# Patient Record
Sex: Female | Born: 1970 | Race: White | Hispanic: Yes | Marital: Married | Smoking: Never smoker
Health system: Southern US, Community
[De-identification: ages and names within clinical notes are randomized; demographics above are authoritative.]

## PROBLEM LIST (undated history)

## (undated) DIAGNOSIS — G43909 Migraine, unspecified, not intractable, without status migrainosus: Secondary | ICD-10-CM

## (undated) DIAGNOSIS — F329 Major depressive disorder, single episode, unspecified: Secondary | ICD-10-CM

## (undated) DIAGNOSIS — F32A Depression, unspecified: Secondary | ICD-10-CM

## (undated) DIAGNOSIS — I10 Essential (primary) hypertension: Secondary | ICD-10-CM

## (undated) HISTORY — DX: Essential (primary) hypertension: I10

## (undated) HISTORY — DX: Major depressive disorder, single episode, unspecified: F32.9

## (undated) HISTORY — DX: Depression, unspecified: F32.A

## (undated) HISTORY — PX: CHOLECYSTECTOMY: SHX55

## (undated) HISTORY — PX: TUBAL LIGATION: SHX77

---

## 2006-01-25 ENCOUNTER — Other Ambulatory Visit: Admission: RE | Admit: 2006-01-25 | Discharge: 2006-01-25 | Payer: Self-pay | Admitting: Obstetrics and Gynecology

## 2006-06-04 ENCOUNTER — Encounter (INDEPENDENT_AMBULATORY_CARE_PROVIDER_SITE_OTHER): Payer: Self-pay | Admitting: *Deleted

## 2006-06-04 ENCOUNTER — Inpatient Hospital Stay (HOSPITAL_COMMUNITY): Admission: AD | Admit: 2006-06-04 | Discharge: 2006-06-07 | Payer: Self-pay | Admitting: Obstetrics and Gynecology

## 2006-07-16 ENCOUNTER — Other Ambulatory Visit: Admission: RE | Admit: 2006-07-16 | Discharge: 2006-07-16 | Payer: Self-pay | Admitting: Obstetrics and Gynecology

## 2009-03-06 ENCOUNTER — Ambulatory Visit (HOSPITAL_COMMUNITY): Admission: AD | Admit: 2009-03-06 | Discharge: 2009-03-07 | Payer: Self-pay | Admitting: Obstetrics & Gynecology

## 2009-03-06 ENCOUNTER — Encounter (INDEPENDENT_AMBULATORY_CARE_PROVIDER_SITE_OTHER): Payer: Self-pay | Admitting: General Surgery

## 2009-10-11 ENCOUNTER — Encounter (INDEPENDENT_AMBULATORY_CARE_PROVIDER_SITE_OTHER): Payer: Self-pay | Admitting: Obstetrics & Gynecology

## 2009-10-11 ENCOUNTER — Inpatient Hospital Stay (HOSPITAL_COMMUNITY): Admission: RE | Admit: 2009-10-11 | Discharge: 2009-10-14 | Payer: Self-pay | Admitting: Obstetrics & Gynecology

## 2010-09-21 LAB — BASIC METABOLIC PANEL
BUN: 4 mg/dL — ABNORMAL LOW (ref 6–23)
CO2: 19 mEq/L (ref 19–32)
Chloride: 110 mEq/L (ref 96–112)
Creatinine, Ser: 0.5 mg/dL (ref 0.4–1.2)

## 2010-09-21 LAB — CBC
MCHC: 33.7 g/dL (ref 30.0–36.0)
MCHC: 34.4 g/dL (ref 30.0–36.0)
MCV: 84.6 fL (ref 78.0–100.0)
Platelets: 195 10*3/uL (ref 150–400)
Platelets: 248 10*3/uL (ref 150–400)
RDW: 13.9 % (ref 11.5–15.5)
WBC: 10.4 10*3/uL (ref 4.0–10.5)

## 2010-09-21 LAB — DIFFERENTIAL
Basophils Relative: 0 % (ref 0–1)
Eosinophils Absolute: 0.2 10*3/uL (ref 0.0–0.7)
Neutrophils Relative %: 66 % (ref 43–77)

## 2010-09-21 LAB — RPR: RPR Ser Ql: NONREACTIVE

## 2010-09-21 LAB — GLUCOSE, CAPILLARY: Glucose-Capillary: 62 mg/dL — ABNORMAL LOW (ref 70–99)

## 2010-10-07 LAB — COMPREHENSIVE METABOLIC PANEL
ALT: 19 U/L (ref 0–35)
AST: 24 U/L (ref 0–37)
CO2: 23 mEq/L (ref 19–32)
Calcium: 9.6 mg/dL (ref 8.4–10.5)
Chloride: 105 mEq/L (ref 96–112)
GFR calc Af Amer: >60 mL/min (ref >60)
GFR calc non Af Amer: >60 mL/min (ref >60)
Sodium: 136 mEq/L (ref 135–145)
Total Bilirubin: 0.6 mg/dL (ref 0.3–1.2)

## 2010-10-07 LAB — URINE MICROSCOPIC-ADD ON

## 2010-10-07 LAB — URINALYSIS, ROUTINE W REFLEX MICROSCOPIC
Glucose, UA: NEGATIVE mg/dL
Protein, ur: NEGATIVE mg/dL

## 2010-10-07 LAB — CBC
RBC: 4.69 MIL/uL (ref 3.87–5.11)
WBC: 11.7 10*3/uL — ABNORMAL HIGH (ref 4.0–10.5)

## 2010-11-18 NOTE — Op Note (Signed)
NAME:  Jill Padilla, Jill Padilla            ACCOUNT NO.:  [id number]   MEDICAL RECORD NO.:  [id number]          PATIENT TYPE:  INP   LOCATION:  9104                          FACILITY:  [facility]   PHYSICIAN:  Rudy Jew. Ashley Royalty, M.D.DATE OF BIRTH:  1970-07-18   DATE OF PROCEDURE:  06/04/2006  DATE OF DISCHARGE:                               OPERATIVE REPORT   PREOPERATIVE DIAGNOSES:  1. Intrauterine pregnancy at 39 weeks 2 days gestation.  2. Active genital herpes simplex virus lesion.   POSTOPERATIVE DIAGNOSES:  1. Intrauterine pregnancy at 39 weeks 2 days gestation.  2. Active genital herpes simplex virus lesion.   PROCEDURE:  Primary low transverse cesarean section.   SURGEON:  Rudy Jew. Ashley Royalty, [name]   ANESTHESIA:  Spinal.   SPECIMEN:  7 pounds 4 ounces female, Apgars 9 at one minute and 9 at  five minutes sent to newborn nursery.   ESTIMATED BLOOD LOSS:  600 mL.   COMPLICATIONS:  None.   PACKS AND DRAINS:  Foley.   SPONGE, NEEDLE, INSTRUMENT COUNT:  Reported as correct x2.   PROCEDURE:  The patient was taken to the operating room and placed in  the sitting position.  After spinal anesthetic was administered she was  placed in the dorsal supine position and prepped and draped in usual  manner for abdominal surgery.  Foley catheter was placed.   A Pfannenstiel skin incision was made down to the level of the fascia  which was nicked with a knife incised transversely with Mayo scissors.  The underlying rectus muscles were separated from the fascia using sharp  and blunt dissection.  Rectus muscles were separated midline exposing  the peritoneum which was elevated with hemostats and entered  atraumatically with Metzenbaum scissors.  Incision was extended  longitudinally.  The uterus was then identified and bladder flap created  by incising the anterior uterine serosa and sharply and bluntly  dissecting the bladder inferiorly.  It was held in place with a bladder  blade.  The uterus  was then entered through a low transverse incision  using sharp and blunt dissection.  The fluid was clear.  The infant was  delivered from vertex presentation in an atraumatic manner.  The infant  was suctioned.  The cord was doubly clamped, cut and infant given  immediately to the awaiting pediatrics team.  Cord blood was obtained.  Placenta and membranes removed in their entirety and submitted to  pathology for histologic studies.  The uterus was exteriorized.  Uterus  was then closed in two running layers of #1 Vicryl.  The first was a  running locking layer.  The second was a running, intermittently  locking, and imbricating layer.  Two additional figure-of-eight sutures  were required to obtain hemostasis.  Hemostasis was noted.  Uterus,  tubes and ovaries were inspected and found to be otherwise normal.  They  were returned to the abdominal cavity.  Copious irrigation was  accomplished.  Hemostasis was noted.   The peritoneum was then closed with 3-0 Vicryl in a running fashion.  The fascia was closed with 0 Vicryl in a running fashion.  The skin was  closed with staples.   The patient tolerated the procedure extremely well and was returned to  the recovery room in good condition.  At the conclusion of the  procedure, the urine was clear and copious.      [name], [name]  Electronically Signed     JAM/MEDQ  D:  06/04/2006  T:  06/05/2006  Job:  [id number]

## 2010-11-18 NOTE — Discharge Summary (Signed)
NAME:  Jill Padilla, Jill Padilla            ACCOUNT NO.:  [id number]   MEDICAL RECORD NO.:  [id number]          PATIENT TYPE:  INP   LOCATION:  9104                          FACILITY:  [facility]   PHYSICIAN:  Rudy Jew. Ashley Royalty, M.D.DATE OF BIRTH:  04/12/1971   DATE OF ADMISSION:  06/04/2006  DATE OF DISCHARGE:  06/07/2006                               DISCHARGE SUMMARY   DISCHARGE DIAGNOSES:  1. Intrauterine pregnancy at 39-weeks 2-days gestation, delivered.  2. Genital herpes simplex virus with active lesion.   OPERATIONS AND PROCEDURES:  Primary low transverse cesarean section.   CONSULTATIONS:  None.   DISCHARGE MEDICATIONS:  Percocet, Motrin.   HISTORY AND PHYSICAL:  This is a 35-year gravida 3, para 2, AB 1 at 68-  weeks 2-days gestation.  Prenatal care was complicated by genital  herpes.  The patient presented to the office on the day of admission  complaining of a new HSV lesion which was confirmed. For the remainder  of the history and physical, please see chart.   HOSPITAL COURSE:  The patient was admitted to [facility]  [place].  Admission laboratory studies were drawn.  On June 04, 2006, she was taken to the operating room and underwent primary low  transverse cesarean section.  The procedure was performed by Dr. Sylvester Harder and yielded a 7-pounds 4-ounces female, Apgars 9 at one minute  and 9 at five minutes, sent to newborn nursery.  The patient's  postpartum course was benign.  She was discharged on the third  postpartum day afebrile and in satisfactory condition.   DISPOSITION:  The patient is to return to [facility]  Obstetrics in 4 to 6 weeks for postpartum evaluation.      [name], [name]  Electronically Signed     JAM/MEDQ  D:  07/18/2006  T:  07/18/2006  Job:  [id number]

## 2016-06-09 ENCOUNTER — Emergency Department (HOSPITAL_BASED_OUTPATIENT_CLINIC_OR_DEPARTMENT_OTHER): Payer: 59

## 2016-06-09 ENCOUNTER — Encounter (HOSPITAL_BASED_OUTPATIENT_CLINIC_OR_DEPARTMENT_OTHER): Payer: Self-pay | Admitting: *Deleted

## 2016-06-09 ENCOUNTER — Emergency Department (HOSPITAL_BASED_OUTPATIENT_CLINIC_OR_DEPARTMENT_OTHER)
Admission: EM | Admit: 2016-06-09 | Discharge: 2016-06-10 | Disposition: A | Discharge status: Home / Self Care | Payer: 59 | Attending: Emergency Medicine | Admitting: Emergency Medicine

## 2016-06-09 DIAGNOSIS — R1011 Right upper quadrant pain: Secondary | ICD-10-CM | POA: Insufficient documentation

## 2016-06-09 DIAGNOSIS — I10 Essential (primary) hypertension: Secondary | ICD-10-CM | POA: Diagnosis not present

## 2016-06-09 DIAGNOSIS — R51 Headache: Secondary | ICD-10-CM | POA: Diagnosis present

## 2016-06-09 DIAGNOSIS — R110 Nausea: Secondary | ICD-10-CM | POA: Insufficient documentation

## 2016-06-09 HISTORY — DX: Migraine, unspecified, not intractable, without status migrainosus: G43.909

## 2016-06-09 LAB — CBC WITH DIFFERENTIAL/PLATELET
Basophils Absolute: 0.1 10*3/uL (ref 0.0–0.1)
Basophils Relative: 1 %
EOS PCT: 1 %
Eosinophils Absolute: 0.1 10*3/uL (ref 0.0–0.7)
HCT: 38.8 % (ref 36.0–46.0)
Hemoglobin: 13.2 g/dL (ref 12.0–15.0)
LYMPHS ABS: 4.7 10*3/uL — AB (ref 0.7–4.0)
LYMPHS PCT: 42 %
MCH: 27.7 pg (ref 26.0–34.0)
MCHC: 34 g/dL (ref 30.0–36.0)
MCV: 81.3 fL (ref 78.0–100.0)
MONO ABS: 0.7 10*3/uL (ref 0.1–1.0)
MONOS PCT: 7 %
Neutro Abs: 5.5 10*3/uL (ref 1.7–7.7)
Neutrophils Relative %: 49 %
PLATELETS: 283 10*3/uL (ref 150–400)
RBC: 4.77 MIL/uL (ref 3.87–5.11)
RDW: 13.6 % (ref 11.5–15.5)
WBC: 11.1 10*3/uL — ABNORMAL HIGH (ref 4.0–10.5)

## 2016-06-09 LAB — BASIC METABOLIC PANEL
Anion gap: 8 (ref 5–15)
BUN: 12 mg/dL (ref 6–20)
CO2: 26 mmol/L (ref 22–32)
Calcium: 9.7 mg/dL (ref 8.9–10.3)
Chloride: 105 mmol/L (ref 101–111)
Creatinine, Ser: 0.71 mg/dL (ref 0.44–1.00)
GFR calc Af Amer: >60 mL/min (ref >60)
GLUCOSE: 120 mg/dL — AB (ref 65–99)
POTASSIUM: 3.3 mmol/L — AB (ref 3.5–5.1)
Sodium: 139 mmol/L (ref 135–145)

## 2016-06-09 LAB — PREGNANCY, URINE: Preg Test, Ur: NEGATIVE

## 2016-06-09 LAB — TROPONIN I
Troponin I: <0.03 ng/mL (ref <0.03)
Troponin I: <0.03 ng/mL (ref <0.03)

## 2016-06-09 MED ORDER — SODIUM CHLORIDE 0.9 % IV BOLUS (SEPSIS)
1000.0000 mL | Freq: Once | INTRAVENOUS | Status: AC
  Administered 2016-06-09: 1000 mL via INTRAVENOUS

## 2016-06-09 MED ORDER — PROCHLORPERAZINE EDISYLATE 5 MG/ML IJ SOLN
10.0000 mg | Freq: Once | INTRAMUSCULAR | Status: AC
  Administered 2016-06-09: 10 mg via INTRAVENOUS
  Filled 2016-06-09: qty 2

## 2016-06-09 MED ORDER — GI COCKTAIL ~~LOC~~
30.0000 mL | Freq: Once | ORAL | Status: AC
  Administered 2016-06-09: 30 mL via ORAL
  Filled 2016-06-09: qty 30

## 2016-06-09 MED ORDER — DIPHENHYDRAMINE HCL 50 MG/ML IJ SOLN
25.0000 mg | Freq: Once | INTRAMUSCULAR | Status: AC
  Administered 2016-06-09: 25 mg via INTRAVENOUS
  Filled 2016-06-09: qty 1

## 2016-06-09 MED ORDER — HYDROCHLOROTHIAZIDE 25 MG PO TABS: 25.0000 mg | ORAL_TABLET | Freq: Every day | ORAL | 0 refills | Status: DC

## 2016-06-09 MED ORDER — ASPIRIN 81 MG PO CHEW
324.0000 mg | CHEWABLE_TABLET | Freq: Once | ORAL | Status: AC
  Administered 2016-06-09: 324 mg via ORAL
  Filled 2016-06-09: qty 4

## 2016-06-09 MED ORDER — POTASSIUM CHLORIDE CRYS ER 20 MEQ PO TBCR
40.0000 meq | EXTENDED_RELEASE_TABLET | Freq: Once | ORAL | Status: AC
  Administered 2016-06-10: 40 meq via ORAL
  Filled 2016-06-09: qty 2

## 2016-06-09 NOTE — ED Provider Notes (Signed)
[facility]-EMERGENCY DEPT Provider Note   CSN: [id number] Arrival date & time: 06/09/16  1941  By signing my name below, I, Jill Padilla, attest that this documentation has been prepared under the direction and in the presence of physician practitioner, Melene Planan Delores Thelen, DO. Electronically Signed: Linna Darnerussell Padilla, Scribe. 06/09/2016. 8:16 PM.  History   Chief Complaint Chief Complaint  Patient presents with  . Hypertension    The history is provided by the patient. No language interpreter was used.  Hypertension  This is a new problem. The current episode started 2 days ago. The problem occurs constantly. The problem has not changed since onset.Associated symptoms include headaches and shortness of breath. Pertinent negatives include no chest pain and no abdominal pain. Nothing aggravates the symptoms. Nothing relieves the symptoms. She has tried nothing for the symptoms. The treatment provided no relief.  Migraine  This is a recurrent problem. The current episode started 2 days ago. The problem occurs constantly. The problem has not changed since onset.Associated symptoms include headaches and shortness of breath. Pertinent negatives include no chest pain and no abdominal pain. Exacerbated by: head movement to the left. The symptoms are relieved by medications (Excedrin). Treatments tried: Excedrin. The treatment provided mild relief.     HPI Comments: Jill Padilla is a 45 y.o. female who presents to the Emergency Department complaining of gradual onset, constant, throbbing, left-sided headache beginning 2 days ago. She notes associated nausea and some intermittent chest tightness and SOB. Pt took Excedrin x2 two days ago with mild relief of her headache. She reports a h/o migraine headaches and states that Excedrin usually provides complete relief. Pt states her migraine headaches are usually located behind her right eye. She reports she experiences photophobia and phonophobia with her typical  migraine headaches but is not having these symptoms with her current headache. She states she noticed her blood pressure was elevated two days ago and has since stopped using Excedrin due to the caffeine. She states her current headache is not as severe as her typical migraine, but will not resolve. She states moving her head to the left worsens her headache. She states her episodes of chest tightness/SOB last for about 1 minute and are worse with exertion; she notes these symptoms are not associated with exercise. She reports some occasional episodes of dizziness for a few years and notes she had one shortly PTA that has resolved. No recent trauma or head injury. No h/o smoking. She notes a h/o pregnancy x4. She reports a h/o borderline HLD. She reports a h/o gestational diabetes during her last pregnancy that resolved. No known h/o HTN. No known FMHx of heart attack. She denies difficulty ambulating, speech difficulty, chest pain, numbness, weakness, vomiting, or any other associated symptoms.  Past Medical History:  Diagnosis Date  . Migraine     There are no active problems to display for this patient.   Past Surgical History:  Procedure Laterality Date  . CESAREAN SECTION    . CHOLECYSTECTOMY    . TUBAL LIGATION      OB History    No data available       Home Medications    Prior to Admission medications   Medication Sig Start Date End Date Taking? Authorizing Provider  hydrochlorothiazide (HYDRODIURIL) 25 MG tablet Take 1 tablet (25 mg total) by mouth daily. 06/09/16   Melene Planan Alissa Pharr, DO    Family History No family history on file.  Social History Social History  Substance Use Topics  .  Smoking status: Never Smoker  . Smokeless tobacco: Not on file  . Alcohol use No     Allergies   Patient has no known allergies.   Review of Systems Review of Systems  Constitutional: Negative for chills and fever.  HENT: Negative for congestion and rhinorrhea.   Eyes: Negative for  photophobia, redness and visual disturbance.  Respiratory: Positive for chest tightness and shortness of breath. Negative for wheezing.   Cardiovascular: Negative for chest pain and palpitations.  Gastrointestinal: Positive for nausea. Negative for abdominal pain and vomiting.  Genitourinary: Negative for dysuria and urgency.  Musculoskeletal: Negative for arthralgias, gait problem and myalgias.  Skin: Negative for pallor and wound.  Neurological: Positive for dizziness (baseline) and headaches. Negative for speech difficulty, weakness and numbness.     Physical Exam Updated Vital Signs BP 134/71   Pulse 79   Temp 98.2 F (36.8 C)   Resp 18   Ht 5\' 3"  (1.6 m)   Wt 194 lb (88 kg)   LMP 06/09/2016   SpO2 97%   BMI 34.37 kg/m   Physical Exam  Constitutional: She is oriented to person, place, and time. She appears well-developed and well-nourished. No distress.  HENT:  Head: Normocephalic and atraumatic.  Eyes: EOM are normal. Pupils are equal, round, and reactive to light.  Neck: Normal range of motion. Neck supple.  Cardiovascular: Normal rate and regular rhythm.  Exam reveals no gallop and no friction rub.   No murmur heard. Pulmonary/Chest: Effort normal. She has no wheezes. She has no rales.  Abdominal: Soft. She exhibits no distension. There is tenderness. There is negative Murphy's sign.  Epigastric tenderness. Mild RUQ tenderness with negative Murphy's sign.  Musculoskeletal: She exhibits no edema or tenderness.  Neurological: She is alert and oriented to person, place, and time. She displays normal reflexes. No cranial nerve deficit or sensory deficit. She exhibits normal muscle tone. Coordination normal.  Skin: Skin is warm and dry. She is not diaphoretic.  Psychiatric: She has a normal mood and affect. Her behavior is normal.  Nursing note and vitals reviewed.    ED Treatments / Results  Labs (all labs ordered are listed, but only abnormal results are  displayed) Labs Reviewed  CBC WITH DIFFERENTIAL/PLATELET - Abnormal; Notable for the following:       Result Value   WBC 11.1 (*)    Lymphs Abs 4.7 (*)    All other components within normal limits  BASIC METABOLIC PANEL - Abnormal; Notable for the following:    Potassium 3.3 (*)    Glucose, Bld 120 (*)    All other components within normal limits  TROPONIN I  TROPONIN I  PREGNANCY, URINE    EKG  EKG Interpretation  Date/Time:  Friday June 09 2016 20:10:14 EST Ventricular Rate:  75 PR Interval:    QRS Duration: 100 QT Interval:  380 QTC Calculation: 425 R Axis:   25 Text Interpretation:  Sinus rhythm Inferior infarct, old Baseline wander in lead(s) III V6 No old tracing to compare Confirmed by Kameka Whan MD, DANIEL [phone]([id number]) on 06/09/2016 8:37:30 PM       Radiology Dg Chest 2 View  Result Date: 06/09/2016 CLINICAL DATA:  Shortness of breath with chest tightness and nausea 3 days. EXAM: CHEST  2 VIEW COMPARISON:  10/07/2014 FINDINGS: The heart size and mediastinal contours are within normal limits. Both lungs are clear. The visualized skeletal structures are unremarkable. IMPRESSION: No active cardiopulmonary disease. Electronically Signed   By: Reuel Boomaniel  Micheline Maze [name]   On: 06/09/2016 21:16    Procedures Procedures (including critical care time)  DIAGNOSTIC STUDIES: Oxygen Saturation is 100% on RA, normal by my interpretation.    COORDINATION OF CARE: 8:22 PM Discussed treatment plan with pt at bedside and pt agreed to plan.  Medications Ordered in ED Medications  prochlorperazine (COMPAZINE) injection 10 mg (10 mg Intravenous Given 06/09/16 2028)  diphenhydrAMINE (BENADRYL) injection 25 mg (25 mg Intravenous Given 06/09/16 2028)  sodium chloride 0.9 % bolus 1,000 mL (0 mLs Intravenous Stopped 06/09/16 2312)  aspirin chewable tablet 324 mg (324 mg Oral Given 06/09/16 2022)  gi cocktail (Maalox,Lidocaine,Donnatal) (30 mLs Oral Given 06/09/16 2022)  potassium chloride SA  (K-DUR,KLOR-CON) CR tablet 40 mEq (40 mEq Oral Given 06/10/16 0002)     Initial Impression / Assessment and Plan / ED Course  I have reviewed the triage vital signs and the nursing notes.  Pertinent labs & imaging results that were available during my care of the patient were reviewed by me and considered in my medical decision making (see chart for details).  Clinical Course     45 yo F With a chief complaint of a headache and chest pain. Headache is been going on for about a week and chest pain for the past couple days. Describes chest pain as a pressure only last for a couple minutes at a time. Nothing seems to make this better or worse. Headache feels like her typical headaches but is on the opposite side from normal. Neuro exam is benign. Patient is hypertensive here in the emergency department. Will obtain a delta troponin.  Turned over to Dr. Fredderick Phenix, awaiting second trop.    Medications given during this visit Medications  prochlorperazine (COMPAZINE) injection 10 mg (10 mg Intravenous Given 06/09/16 2028)  diphenhydrAMINE (BENADRYL) injection 25 mg (25 mg Intravenous Given 06/09/16 2028)  sodium chloride 0.9 % bolus 1,000 mL (0 mLs Intravenous Stopped 06/09/16 2312)  aspirin chewable tablet 324 mg (324 mg Oral Given 06/09/16 2022)  gi cocktail (Maalox,Lidocaine,Donnatal) (30 mLs Oral Given 06/09/16 2022)  potassium chloride SA (K-DUR,KLOR-CON) CR tablet 40 mEq (40 mEq Oral Given 06/10/16 0002)     The patient appears reasonably screen and/or stabilized for discharge and I doubt any other medical condition or other [facility] requiring further screening, evaluation, or treatment in the ED at this time prior to discharge.    Final Clinical Impressions(s) / ED Diagnoses   Final diagnoses:  Hypertension, unspecified type    New Prescriptions Discharge Medication List as of 06/09/2016 11:54 PM    START taking these medications   Details  hydrochlorothiazide (HYDRODIURIL) 25 MG tablet  Take 1 tablet (25 mg total) by mouth daily., Starting Fri 06/09/2016, Print       I personally performed the services described in this documentation, which was scribed in my presence. The recorded information has been reviewed and is accurate.     Melene Plan, DO 06/10/16 1640

## 2016-06-09 NOTE — Discharge Instructions (Addendum)
Follow up with a family doc.    Try zantac 150mg  twice a day.

## 2016-06-09 NOTE — ED Triage Notes (Signed)
Pt c/o hypertension and h/a x 3 days

## 2016-06-10 NOTE — ED Provider Notes (Signed)
2nd trop neg.  Pt pain free.  No current chest pain.  D/c with Dr. Lanetta InchFloyd's discharge instructions/med.  Encouraged pt to have f/u with PCP.  Return precautions given.  Advised that her K was a little low and needs to be rechecked by PCP, especially given that she is being started on HCTZ.  Given a dose of K-dur here.   Rolan BuccoMelanie Anecia Nusbaum, MD 06/10/16 0005

## 2017-01-02 ENCOUNTER — Telehealth: Payer: Self-pay | Admitting: Behavioral Health

## 2017-01-02 NOTE — Telephone Encounter (Signed)
Unable to reach patient at time of Pre-Visit Call.  Left message for patient to return call when available.    

## 2017-01-04 ENCOUNTER — Telehealth: Payer: Self-pay

## 2017-01-04 NOTE — Telephone Encounter (Signed)
Pre visit call completed 

## 2017-01-05 ENCOUNTER — Ambulatory Visit (INDEPENDENT_AMBULATORY_CARE_PROVIDER_SITE_OTHER): Payer: 59 | Admitting: Medical

## 2017-01-05 ENCOUNTER — Encounter: Payer: Self-pay | Admitting: Medical

## 2017-01-05 VITALS — BP 170/100 | HR 72 | Temp 98.3°F | Resp 16 | Ht 63.0 in | Wt 204.8 lb

## 2017-01-05 DIAGNOSIS — E669 Obesity, unspecified: Secondary | ICD-10-CM

## 2017-01-05 DIAGNOSIS — M25549 Pain in joints of unspecified hand: Secondary | ICD-10-CM

## 2017-01-05 DIAGNOSIS — R319 Hematuria, unspecified: Secondary | ICD-10-CM | POA: Diagnosis not present

## 2017-01-05 DIAGNOSIS — I10 Essential (primary) hypertension: Secondary | ICD-10-CM

## 2017-01-05 DIAGNOSIS — R5383 Other fatigue: Secondary | ICD-10-CM

## 2017-01-05 DIAGNOSIS — W57XXXA Bitten or stung by nonvenomous insect and other nonvenomous arthropods, initial encounter: Secondary | ICD-10-CM

## 2017-01-05 LAB — COMPREHENSIVE METABOLIC PANEL
ALBUMIN: 4.2 g/dL (ref 3.5–5.2)
ALT: 18 U/L (ref 0–35)
AST: 16 U/L (ref 0–37)
Alkaline Phosphatase: 64 U/L (ref 39–117)
BILIRUBIN TOTAL: 0.4 mg/dL (ref 0.2–1.2)
BUN: 12 mg/dL (ref 6–23)
CALCIUM: 9.5 mg/dL (ref 8.4–10.5)
CHLORIDE: 104 meq/L (ref 96–112)
CO2: 25 mEq/L (ref 19–32)
CREATININE: 0.79 mg/dL (ref 0.40–1.20)
GFR: 83.54 mL/min (ref >60.00)
Glucose, Bld: 123 mg/dL — ABNORMAL HIGH (ref 70–99)
Potassium: 3.6 mEq/L (ref 3.5–5.1)
Sodium: 137 mEq/L (ref 135–145)
Total Protein: 7.4 g/dL (ref 6.0–8.3)

## 2017-01-05 LAB — POC URINALSYSI DIPSTICK (AUTOMATED)
Bilirubin, UA: NEGATIVE
Glucose, UA: NEGATIVE
KETONES UA: NEGATIVE
Leukocytes, UA: NEGATIVE
Nitrite, UA: NEGATIVE
PROTEIN UA: NEGATIVE
UROBILINOGEN UA: NEGATIVE U/dL — AB
pH, UA: 7.5 (ref 5.0–8.0)

## 2017-01-05 LAB — CBC WITH DIFFERENTIAL/PLATELET
BASOS ABS: 0.1 10*3/uL (ref 0.0–0.1)
Basophils Relative: 1 % (ref 0.0–3.0)
Eosinophils Absolute: 0.2 10*3/uL (ref 0.0–0.7)
Eosinophils Relative: 1.9 % (ref 0.0–5.0)
HCT: 38.7 % (ref 36.0–46.0)
HEMOGLOBIN: 13.2 g/dL (ref 12.0–15.0)
LYMPHS PCT: 35.5 % (ref 12.0–46.0)
Lymphs Abs: 3.7 10*3/uL (ref 0.7–4.0)
MCHC: 34 g/dL (ref 30.0–36.0)
MCV: 83 fl (ref 78.0–100.0)
MONOS PCT: 6.4 % (ref 3.0–12.0)
Monocytes Absolute: 0.7 10*3/uL (ref 0.1–1.0)
Neutro Abs: 5.7 10*3/uL (ref 1.4–7.7)
Neutrophils Relative %: 55.2 % (ref 43.0–77.0)
Platelets: 276 10*3/uL (ref 150.0–400.0)
RBC: 4.66 Mil/uL (ref 3.87–5.11)
RDW: 13.7 % (ref 11.5–15.5)
WBC: 10.4 10*3/uL (ref 4.0–10.5)

## 2017-01-05 LAB — C-REACTIVE PROTEIN: CRP: 0.7 mg/dL (ref 0.5–20.0)

## 2017-01-05 LAB — T4, FREE: FREE T4: 0.84 ng/dL (ref 0.60–1.60)

## 2017-01-05 LAB — TSH: TSH: 2.62 u[IU]/mL (ref 0.35–4.50)

## 2017-01-05 LAB — SEDIMENTATION RATE: SED RATE: 10 mm/h (ref 0–20)

## 2017-01-05 MED ORDER — LOSARTAN POTASSIUM 50 MG PO TABS: 50.0000 mg | ORAL_TABLET | Freq: Every day | ORAL | 3 refills | Status: DC

## 2017-01-05 MED ORDER — HYDROCHLOROTHIAZIDE 12.5 MG PO CAPS: 12.5000 mg | ORAL_CAPSULE | Freq: Every day | ORAL | 3 refills | Status: DC

## 2017-01-05 NOTE — Progress Notes (Signed)
Subjective:    Patient ID: Jill Padilla, female    DOB: 1970/08/20, 46 y.o.   MRN: [id number]  HPI   Pt in for first time  Establish care.  Pt has htn and she has not been on meds. When was on med bp 132/72. Not on meds for 3-4 weeks. Pt was losartan 50 mg in the past and on hctz 12.5 mg. She starts this did work. Pt was also on k with meds. Her k level would drop when she was on hctz.  Pt has some history of some depression in the past. Comes and goes. Not severe. Scored 8 on depression. Pt never been on medication for depression.  Pt in past told some renal insufficiency. Has seen nephrologist. Told to control bp and reduce protein.  Pt has some blood in urine history. Pt has seen urologist about 2 months ago and cystoscopy was negative. No current cva pain.  Pt had a1c in past and was normal.   Hx of arthralgia in past. Has knee pain but told xrays are normal.   lmp-June 11,2018.   Review of Systems  Constitutional: Positive for fatigue. Negative for chills, fever and unexpected weight change.  HENT: Negative for congestion, ear discharge, nosebleeds and postnasal drip.   Respiratory: Negative for cough, chest tightness, shortness of breath and wheezing.   Cardiovascular: Negative for chest pain and palpitations.  Gastrointestinal: Negative for abdominal pain.  Genitourinary: Positive for hematuria. Negative for difficulty urinating, dysuria, flank pain, pelvic pain and vaginal bleeding.  Musculoskeletal: Negative for back pain and joint swelling.  Neurological: Negative for dizziness, speech difficulty, numbness and headaches.  Hematological: Negative for adenopathy. Does not bruise/bleed easily.  Psychiatric/Behavioral: Positive for dysphoric mood. Negative for agitation, behavioral problems, decreased concentration, self-injury and sleep disturbance. The patient is not nervous/anxious.     Past Medical History:  Diagnosis Date  . Hypertension   . Migraine        Social History   Social History  . Marital status: Married    Spouse name: N/A  . Number of children: N/A  . Years of education: N/A   Occupational History  . Not on file.   Social History Main Topics  . Smoking status: Never Smoker  . Smokeless tobacco: Never Used  . Alcohol use No  . Drug use: No  . Sexual activity: No   Other Topics Concern  . Not on file   Social History Narrative  . No narrative on file    Past Surgical History:  Procedure Laterality Date  . CESAREAN SECTION    . CHOLECYSTECTOMY    . TUBAL LIGATION      Family History  Problem Relation Age of Onset  . Arthritis Mother   . Hypertension Mother   . Diabetes Mother     Allergies  Allergen Reactions  . Amlodipine Swelling    Current Outpatient Prescriptions on File Prior to Visit  Medication Sig Dispense Refill  . hydrochlorothiazide (HYDRODIURIL) 25 MG tablet Take 1 tablet (25 mg total) by mouth daily. 30 tablet 0  . losartan (COZAAR) 50 MG tablet Take 50 mg by mouth daily.    . potassium chloride (MICRO-K) 10 MEQ CR capsule Take 10 mEq by mouth 2 (two) times daily.    . traMADol (ULTRAM) 50 MG tablet Take by mouth every 6 (six) hours as needed.     No current facility-administered medications on file prior to visit.     BP (!) 181/99 (BP  Location: Right Arm, Patient Position: Sitting, Cuff Size: Normal)   Pulse 72   Temp 98.3 F (36.8 C) (Oral)   Resp 16   Ht 5\' 3"  (1.6 m)   Wt 204 lb 12.8 oz (92.9 kg)   SpO2 100%   BMI 36.28 kg/m       Objective:   Physical Exam  General Mental Status- Alert. General Appearance- Not in acute distress.   Skin General: Color- Normal Color. Moisture- Normal Moisture.  Neck Carotid Arteries- Normal color. Moisture- Normal Moisture. No carotid bruits. No JVD.  Chest and Lung Exam Auscultation: Breath Sounds:-Normal.  Cardiovascular Auscultation:Rythm- Regular. Murmurs & Other Heart Sounds:Auscultation of the heart reveals- No  Murmurs.  Abdomen Inspection:-Inspeection Normal. Palpation/Percussion:Note:No mass. Palpation and Percussion of the abdomen reveal- Non Tender, Non Distended + BS, no rebound or guarding.    Neurologic Cranial Nerve exam:- CN III-XII intact(No nystagmus), symmetric smile. Strength:- 5/5 equal and symmetric strength both upper and lower extremities.      Assessment & Plan:  For your htn high today but not on med will refill your losartan and hctz.  For hematuria follow up with urologist as they recommend.  For obesity recommend tsh, t4 but also try weight watchers program and exercise. Might consider meds in future but want bp to be controlled.  For joint pains arthritic panel.  For tick bite hx tick bite studies.  For fatigue get cbc.   Follow up in 10-14 days or as needed  If any cardiac or neurologic signs or symptoms then ED eval.  Yarel Rushlow, Ramon DredgeEdward, PA-C

## 2017-01-05 NOTE — Patient Instructions (Signed)
For your htn high today but not on med will refill your losartan and hctz.  For hematuria follow up with urologist as they recommend.  For obesity recommend tsh, t4 but also try weight watchers program and exercise. Might consider meds in future but want bp to be controlled.  For joint pains arthritic panel.  For tick bite hx tick bite studies.  For fatigue get cbc.   Follow up in 10-14 days or as needed

## 2017-01-06 ENCOUNTER — Telehealth: Payer: Self-pay | Admitting: Medical

## 2017-01-06 DIAGNOSIS — R739 Hyperglycemia, unspecified: Secondary | ICD-10-CM

## 2017-01-06 NOTE — Telephone Encounter (Signed)
I added a1c. Labs drawn last week. Can blood work be added?

## 2017-01-08 LAB — ROCKY MTN SPOTTED FVR ABS PNL(IGG+IGM)
RMSF IGG: NOT DETECTED
RMSF IGM: NOT DETECTED

## 2017-01-08 LAB — ANA: ANA: NEGATIVE

## 2017-01-08 LAB — LYME AB/WESTERN BLOT REFLEX: B burgdorferi Ab IgG+IgM: <0.9 Index (ref <0.90)

## 2017-01-08 LAB — RHEUMATOID FACTOR

## 2017-01-08 NOTE — Telephone Encounter (Signed)
[facility] for [facility], Future telephone note can be cancelled. KMP

## 2017-01-09 ENCOUNTER — Other Ambulatory Visit (INDEPENDENT_AMBULATORY_CARE_PROVIDER_SITE_OTHER): Payer: 59

## 2017-01-09 DIAGNOSIS — R739 Hyperglycemia, unspecified: Secondary | ICD-10-CM | POA: Diagnosis not present

## 2017-01-09 LAB — HEMOGLOBIN A1C: HEMOGLOBIN A1C: 6.1 % (ref 4.6–6.5)

## 2017-01-19 ENCOUNTER — Ambulatory Visit (INDEPENDENT_AMBULATORY_CARE_PROVIDER_SITE_OTHER): Payer: 59 | Admitting: Medical

## 2017-01-19 VITALS — BP 138/88 | HR 84 | Temp 98.1°F | Resp 16 | Ht 63.0 in | Wt 202.4 lb

## 2017-01-19 DIAGNOSIS — M25561 Pain in right knee: Secondary | ICD-10-CM | POA: Diagnosis not present

## 2017-01-19 DIAGNOSIS — R739 Hyperglycemia, unspecified: Secondary | ICD-10-CM

## 2017-01-19 DIAGNOSIS — M2550 Pain in unspecified joint: Secondary | ICD-10-CM | POA: Diagnosis not present

## 2017-01-19 DIAGNOSIS — M79641 Pain in right hand: Secondary | ICD-10-CM | POA: Diagnosis not present

## 2017-01-19 DIAGNOSIS — I10 Essential (primary) hypertension: Secondary | ICD-10-CM | POA: Diagnosis not present

## 2017-01-19 DIAGNOSIS — M25562 Pain in left knee: Secondary | ICD-10-CM

## 2017-01-19 DIAGNOSIS — G8929 Other chronic pain: Secondary | ICD-10-CM

## 2017-01-19 DIAGNOSIS — M79642 Pain in left hand: Secondary | ICD-10-CM

## 2017-01-19 MED ORDER — MELOXICAM 7.5 MG PO TABS
ORAL_TABLET | ORAL | 0 refills | Status: DC
Start: 2017-01-19 — End: 2017-10-11

## 2017-01-19 MED ORDER — METFORMIN HCL 500 MG PO TABS: 500.0000 mg | ORAL_TABLET | Freq: Two times a day (BID) | ORAL | 0 refills | Status: DC

## 2017-01-19 NOTE — Patient Instructions (Addendum)
Your bp is better now that your restarted medication  For joint pain will get xrays to assess joints in more detail. Inlammation studies were negative as discussed. Mobic for pain and inflammation.  For elevated blood sugars will rx metformin. Repeat a1c in 3 months.  Follow up for CPE in 3 -4 weeks.

## 2017-01-19 NOTE — Progress Notes (Signed)
Subjective:    Patient ID: Jill Padilla, female    DOB: 10-19-1970, 46 y.o.   MRN: [id number]  HPI  Pt in for bp follow up.  Refilled her bp meds. Pt has not been checking bp at home. Compliant on meds. No cardiac or neurologic signs or symptoms.   Pt has hx of joint pain for years. Hands and knees are worse. Arthritis panel was negative. Pt hands also hurt. She states also has crepitus in her knees. Pain is constant.  Pt has mid high sugar elevation on labs but a1c only 6.1.     Review of Systems  Constitutional: Negative for chills, fatigue and fever.  Respiratory: Negative for cough, chest tightness, shortness of breath and wheezing.   Cardiovascular: Negative for chest pain and palpitations.  Gastrointestinal: Negative for abdominal pain, blood in stool and nausea.  Genitourinary: Negative for dysuria, flank pain and frequency.  Musculoskeletal: Positive for arthralgias. Negative for back pain, myalgias and neck pain.  Skin: Negative for rash.  Hematological: Negative for adenopathy. Does not bruise/bleed easily.  Psychiatric/Behavioral: Negative for behavioral problems and confusion.    Past Medical History:  Diagnosis Date  . Depression    mild.  . Hypertension   . Migraine      Social History   Social History  . Marital status: Married    Spouse name: N/A  . Number of children: N/A  . Years of education: N/A   Occupational History  . Not on file.   Social History Main Topics  . Smoking status: Never Smoker  . Smokeless tobacco: Never Used  . Alcohol use Yes     Comment: rare social.  . Drug use: No  . Sexual activity: No   Other Topics Concern  . Not on file   Social History Narrative  . No narrative on file    Past Surgical History:  Procedure Laterality Date  . CESAREAN SECTION    . CHOLECYSTECTOMY    . TUBAL LIGATION      Family History  Problem Relation Age of Onset  . Arthritis Mother   . Hypertension Mother   . Diabetes Mother      Allergies  Allergen Reactions  . Amlodipine Swelling    Current Outpatient Prescriptions on File Prior to Visit  Medication Sig Dispense Refill  . hydrochlorothiazide (MICROZIDE) 12.5 MG capsule Take 1 capsule (12.5 mg total) by mouth daily. 30 capsule 3  . losartan (COZAAR) 50 MG tablet Take 1 tablet (50 mg total) by mouth daily. 30 tablet 3  . potassium chloride (MICRO-K) 10 MEQ CR capsule Take 10 mEq by mouth 2 (two) times daily.    . traMADol (ULTRAM) 50 MG tablet Take by mouth every 6 (six) hours as needed.     No current facility-administered medications on file prior to visit.     BP 138/88   Pulse 84   Temp 98.1 F (36.7 C) (Oral)   Resp 16   Ht 5\' 3"  (1.6 m)   Wt 202 lb 6.4 oz (91.8 kg)   SpO2 100%   BMI 35.85 kg/m       Objective:   Physical Exam  General Mental Status- Alert. General Appearance- Not in acute distress.   Skin General: Color- Normal Color. Moisture- Normal Moisture.  Neck Carotid Arteries- Normal color. Moisture- Normal Moisture. No carotid bruits. No JVD.  Chest and Lung Exam Auscultation: Breath Sounds:-Normal.  Cardiovascular Auscultation:Rythm- Regular. Murmurs & Other Heart Sounds:Auscultation of the heart  reveals- No Murmurs.  Abdomen Inspection:-Inspeection Normal. Palpation/Percussion:Note:No mass. Palpation and Percussion of the abdomen reveal- Non Tender, Non Distended + BS, no rebound or guarding.    Neurologic Cranial Nerve exam:- CN III-XII intact(No nystagmus), symmetric smile. Strength:- 5/5 equal and symmetric strength both upper and lower extremities.  Hands- bilaterally has joint swelling mid-moderate symmetric. No warmth.  Knees- lt side- good rom, no crepitus. Rt side- good rom, moderate crepitus.      Assessment & Plan:  Your bp is better now that your restarted medication  For joint pain will get xrays to assess joints in more detail. Inlammation studies were negative as discussed. Mobic for pain  and inflammation.  For elevated blood sugars will rx metformin. Repeat a1c in 3 months.  Follow up for CPE in 3 -4 weeks.  Pt update be that blood in her urine was worked up no cause was found. Cystoscopy done.  Aditya Nastasi, Ramon Dredge, PA-C

## 2017-01-23 ENCOUNTER — Ambulatory Visit (HOSPITAL_BASED_OUTPATIENT_CLINIC_OR_DEPARTMENT_OTHER)
Admission: RE | Admit: 2017-01-23 | Discharge: 2017-01-23 | Disposition: A | Discharge status: Home / Self Care | Payer: 59 | Source: Ambulatory Visit | Attending: Medical | Admitting: Medical

## 2017-01-23 DIAGNOSIS — M79642 Pain in left hand: Secondary | ICD-10-CM | POA: Diagnosis not present

## 2017-01-23 DIAGNOSIS — M25561 Pain in right knee: Secondary | ICD-10-CM | POA: Diagnosis not present

## 2017-01-23 DIAGNOSIS — M25562 Pain in left knee: Secondary | ICD-10-CM | POA: Diagnosis not present

## 2017-01-23 DIAGNOSIS — M1711 Unilateral primary osteoarthritis, right knee: Secondary | ICD-10-CM | POA: Diagnosis not present

## 2017-01-23 DIAGNOSIS — M79641 Pain in right hand: Secondary | ICD-10-CM | POA: Diagnosis not present

## 2017-01-23 DIAGNOSIS — G8929 Other chronic pain: Secondary | ICD-10-CM | POA: Diagnosis not present

## 2017-01-24 ENCOUNTER — Telehealth: Payer: Self-pay | Admitting: Medical

## 2017-01-24 DIAGNOSIS — M25562 Pain in left knee: Principal | ICD-10-CM

## 2017-01-24 DIAGNOSIS — M25561 Pain in right knee: Principal | ICD-10-CM

## 2017-01-24 DIAGNOSIS — G8929 Other chronic pain: Secondary | ICD-10-CM

## 2017-01-24 NOTE — Telephone Encounter (Signed)
Referral to ortho placed.

## 2017-02-19 ENCOUNTER — Ambulatory Visit (INDEPENDENT_AMBULATORY_CARE_PROVIDER_SITE_OTHER): Payer: 59 | Admitting: Medical

## 2017-02-19 ENCOUNTER — Telehealth: Payer: Self-pay | Admitting: Medical

## 2017-02-19 ENCOUNTER — Encounter: Payer: Self-pay | Admitting: Medical

## 2017-02-19 VITALS — BP 139/90 | HR 86 | Temp 98.4°F | Resp 16 | Ht 63.0 in | Wt 203.2 lb

## 2017-02-19 DIAGNOSIS — Z0000 Encounter for general adult medical examination without abnormal findings: Secondary | ICD-10-CM

## 2017-02-19 DIAGNOSIS — R7989 Other specified abnormal findings of blood chemistry: Secondary | ICD-10-CM | POA: Diagnosis not present

## 2017-02-19 DIAGNOSIS — Z23 Encounter for immunization: Secondary | ICD-10-CM | POA: Diagnosis not present

## 2017-02-19 DIAGNOSIS — Z1231 Encounter for screening mammogram for malignant neoplasm of breast: Secondary | ICD-10-CM

## 2017-02-19 DIAGNOSIS — Z113 Encounter for screening for infections with a predominantly sexual mode of transmission: Secondary | ICD-10-CM

## 2017-02-19 LAB — COMPREHENSIVE METABOLIC PANEL
ALT: 19 U/L (ref 0–35)
AST: 20 U/L (ref 0–37)
Albumin: 3.6 g/dL (ref 3.5–5.2)
Alkaline Phosphatase: 62 U/L (ref 39–117)
BILIRUBIN TOTAL: 0.3 mg/dL (ref 0.2–1.2)
BUN: 11 mg/dL (ref 6–23)
CHLORIDE: 103 meq/L (ref 96–112)
CO2: 27 meq/L (ref 19–32)
CREATININE: 0.72 mg/dL (ref 0.40–1.20)
Calcium: 8.9 mg/dL (ref 8.4–10.5)
GFR: 92.93 mL/min (ref >60.00)
Glucose, Bld: 114 mg/dL — ABNORMAL HIGH (ref 70–99)
Potassium: 3.6 mEq/L (ref 3.5–5.1)
SODIUM: 136 meq/L (ref 135–145)
Total Protein: 7 g/dL (ref 6.0–8.3)

## 2017-02-19 LAB — LIPID PANEL
Cholesterol: 190 mg/dL (ref 0–200)
HDL: 31.2 mg/dL — ABNORMAL LOW (ref >39.00)
Total CHOL/HDL Ratio: 6
Triglycerides: 521 mg/dL — ABNORMAL HIGH (ref 0.0–149.0)

## 2017-02-19 LAB — URINALYSIS, ROUTINE W REFLEX MICROSCOPIC
BILIRUBIN URINE: NEGATIVE
KETONES UR: NEGATIVE
LEUKOCYTES UA: NEGATIVE
NITRITE: NEGATIVE
PH: 6 (ref 5.0–8.0)
Specific Gravity, Urine: 1.01 (ref 1.000–1.030)
TOTAL PROTEIN, URINE-UPE24: NEGATIVE
URINE GLUCOSE: NEGATIVE
UROBILINOGEN UA: 0.2 (ref 0.0–1.0)

## 2017-02-19 LAB — LDL CHOLESTEROL, DIRECT: LDL DIRECT: 90 mg/dL

## 2017-02-19 MED ORDER — FENOFIBRATE 48 MG PO TABS: 48.0000 mg | ORAL_TABLET | Freq: Every day | ORAL | 3 refills | Status: DC

## 2017-02-19 NOTE — Patient Instructions (Addendum)
For you wellness exam today I have ordered cmp,  lipid panel,  and hiv. UA order placed today Recent cbc and tsh done.  Mammogram order placed. You can go down stairs to have that scheduled.  Vaccine given today tdap.  Recommend exercise and healthy diet.  We will let you know lab results as they come in.  Need to fill out form when labs back  Follow up date appointment will be determined after lab review.    Preventive Care 40-64 Years, Female Preventive care refers to lifestyle choices and visits with your health care provider that can promote health and wellness. What does preventive care include?  A yearly physical exam. This is also called an annual well check.  Dental exams once or twice a year.  Routine eye exams. Ask your health care provider how often you should have your eyes checked.  Personal lifestyle choices, including: ? Daily care of your teeth and gums. ? Regular physical activity. ? Eating a healthy diet. ? Avoiding tobacco and drug use. ? Limiting alcohol use. ? Practicing safe sex. ? Taking low-dose aspirin daily starting at age 96. ? Taking vitamin and mineral supplements as recommended by your health care provider. What happens during an annual well check? The services and screenings done by your health care provider during your annual well check will depend on your age, overall health, lifestyle risk factors, and family history of disease. Counseling Your health care provider may ask you questions about your:  Alcohol use.  Tobacco use.  Drug use.  Emotional well-being.  Home and relationship well-being.  Sexual activity.  Eating habits.  Work and work Statistician.  Method of birth control.  Menstrual cycle.  Pregnancy history.  Screening You may have the following tests or measurements:  Height, weight, and BMI.  Blood pressure.  Lipid and cholesterol levels. These may be checked every 5 years, or more frequently if you are  over 49 years old.  Skin check.  Lung cancer screening. You may have this screening every year starting at age 58 if you have a 30-pack-year history of smoking and currently smoke or have quit within the past 15 years.  Fecal occult blood test (FOBT) of the stool. You may have this test every year starting at age 61.  Flexible sigmoidoscopy or colonoscopy. You may have a sigmoidoscopy every 5 years or a colonoscopy every 10 years starting at age 17.  Hepatitis C blood test.  Hepatitis B blood test.  Sexually transmitted disease (STD) testing.  Diabetes screening. This is done by checking your blood sugar (glucose) after you have not eaten for a while (fasting). You may have this done every 1-3 years.  Mammogram. This may be done every 1-2 years. Talk to your health care provider about when you should start having regular mammograms. This may depend on whether you have a family history of breast cancer.  BRCA-related cancer screening. This may be done if you have a family history of breast, ovarian, tubal, or peritoneal cancers.  Pelvic exam and Pap test. This may be done every 3 years starting at age 37. Starting at age 27, this may be done every 5 years if you have a Pap test in combination with an HPV test.  Bone density scan. This is done to screen for osteoporosis. You may have this scan if you are at high risk for osteoporosis.  Discuss your test results, treatment options, and if necessary, the need for more tests with your health  care provider. Vaccines Your health care provider may recommend certain vaccines, such as:  Influenza vaccine. This is recommended every year.  Tetanus, diphtheria, and acellular pertussis (Tdap, Td) vaccine. You may need a Td booster every 10 years.  Varicella vaccine. You may need this if you have not been vaccinated.  Zoster vaccine. You may need this after age 39.  Measles, mumps, and rubella (MMR) vaccine. You may need at least one dose of  MMR if you were born in 1957 or later. You may also need a second dose.  Pneumococcal 13-valent conjugate (PCV13) vaccine. You may need this if you have certain conditions and were not previously vaccinated.  Pneumococcal polysaccharide (PPSV23) vaccine. You may need one or two doses if you smoke cigarettes or if you have certain conditions.  Meningococcal vaccine. You may need this if you have certain conditions.  Hepatitis A vaccine. You may need this if you have certain conditions or if you travel or work in places where you may be exposed to hepatitis A.  Hepatitis B vaccine. You may need this if you have certain conditions or if you travel or work in places where you may be exposed to hepatitis B.  Haemophilus influenzae type b (Hib) vaccine. You may need this if you have certain conditions.  Talk to your health care provider about which screenings and vaccines you need and how often you need them. This information is not intended to replace advice given to you by your health care provider. Make sure you discuss any questions you have with your health care provider. Document Released: 07/16/2015 Document Revised: 03/08/2016 Document Reviewed: 04/20/2015 Elsevier Interactive Patient Education  2017 [employer].

## 2017-02-19 NOTE — Telephone Encounter (Signed)
Rx fenofibrate sent to pharmacy.

## 2017-02-19 NOTE — Telephone Encounter (Signed)
poct urine placed so lab could result.

## 2017-02-19 NOTE — Progress Notes (Signed)
Subjective:    Patient ID: Jill Padilla, female    DOB: 07-31-1970, 46 y.o.   MRN: [id number]  HPI   Pt in for a physical. She is fasting.  Pt due for tdap. She is ok with getting today.  Pt had pap smear in March this past year. Was normal.  Mammogram not ordered. No lumps or concerns for pt. Last one negative 2 years ago.  Pt states not exercising recently due to rain.   Non smoker and   lmp- one week ago.   Review of Systems  Constitutional: Negative for chills, fatigue and fever.  HENT: Negative for congestion, ear pain, hearing loss, postnasal drip, rhinorrhea, sinus pain and sinus pressure.   Respiratory: Negative for cough, chest tightness, shortness of breath and wheezing.   Cardiovascular: Negative for chest pain and palpitations.  Gastrointestinal: Negative for abdominal pain.  Genitourinary: Negative for decreased urine volume, dysuria, flank pain, frequency, hematuria and vaginal pain.  Musculoskeletal: Negative for back pain.  Skin: Negative for rash.  Neurological: Negative for dizziness, tremors, syncope and headaches.  Hematological: Negative for adenopathy. Does not bruise/bleed easily.  Psychiatric/Behavioral: Negative for behavioral problems and confusion.   Past Medical History:  Diagnosis Date  . Depression    mild.  . Hypertension   . Migraine      Social History   Social History  . Marital status: Married    Spouse name: N/A  . Number of children: N/A  . Years of education: N/A   Occupational History  . Not on file.   Social History Main Topics  . Smoking status: Never Smoker  . Smokeless tobacco: Never Used  . Alcohol use Yes     Comment: rare social.  . Drug use: No  . Sexual activity: No   Other Topics Concern  . Not on file   Social History Narrative  . No narrative on file    Past Surgical History:  Procedure Laterality Date  . CESAREAN SECTION    . CHOLECYSTECTOMY    . TUBAL LIGATION      Family History    Problem Relation Age of Onset  . Arthritis Mother   . Hypertension Mother   . Diabetes Mother     Allergies  Allergen Reactions  . Amlodipine Swelling    Current Outpatient Prescriptions on File Prior to Visit  Medication Sig Dispense Refill  . hydrochlorothiazide (MICROZIDE) 12.5 MG capsule Take 1 capsule (12.5 mg total) by mouth daily. 30 capsule 3  . losartan (COZAAR) 50 MG tablet Take 1 tablet (50 mg total) by mouth daily. 30 tablet 3  . meloxicam (MOBIC) 7.5 MG tablet 1-2 tab a day 30 tablet 0  . metFORMIN (GLUCOPHAGE) 500 MG tablet Take 1 tablet (500 mg total) by mouth 2 (two) times daily with a meal. 30 tablet 0  . potassium chloride (MICRO-K) 10 MEQ CR capsule Take 10 mEq by mouth 2 (two) times daily.    . traMADol (ULTRAM) 50 MG tablet Take by mouth every 6 (six) hours as needed.     No current facility-administered medications on file prior to visit.     BP (!) 139/91   Pulse 86   Temp 98.4 F (36.9 C) (Oral)   Resp 16   Ht 5\' 3"  (1.6 m)   Wt 203 lb 3.2 oz (92.2 kg)   SpO2 100%   BMI 36.00 kg/m       Objective:   Physical Exam  General Mental Status-  Alert. General Appearance- Not in acute distress.   Skin General: Color- Normal Color. Moisture- Normal Moisture. No worrisome lesions on inspection.  Neck Carotid Arteries- Normal color. Moisture- Normal Moisture. No carotid bruits. No JVD.  Chest and Lung Exam Auscultation: Breath Sounds:-Normal.  Cardiovascular Auscultation:Rythm- Regular. Murmurs & Other Heart Sounds:Auscultation of the heart reveals- No Murmurs.  Abdomen Inspection:-Inspeection Normal. Palpation/Percussion:Note:No mass. Palpation and Percussion of the abdomen reveal- Non Tender, Non Distended + BS, no rebound or guarding.   Neurologic Cranial Nerve exam:- CN III-XII intact(No nystagmus), symmetric smile. Strength:- 5/5 equal and symmetric strength both upper and lower extremities.      Assessment & Plan:  For you  wellness exam today I have ordered cmp,  lipid panel,  and hiv. UA order placed today Recent cbc and tsh done.  Mammogram order placed. You can go down stairs to have that scheduled.  Vaccine given today tdap.  Recommend exercise and healthy diet.  We will let you know lab results as they come in.  Need to fill out form when labs back  Follow up date appointment will be determined after lab review.   Elmarie Devlin, Ramon Dredge, PA-C

## 2017-02-20 LAB — HIV ANTIBODY (ROUTINE TESTING W REFLEX): HIV: NONREACTIVE

## 2017-02-27 ENCOUNTER — Telehealth: Payer: Self-pay

## 2017-02-27 NOTE — Telephone Encounter (Signed)
PA initiated via Covermymeds; KEY: [id number]. Awaiting determination.

## 2017-02-28 NOTE — Telephone Encounter (Signed)
PA was denied. Pt's insurance do not cover fenofibrate. Please advise

## 2017-03-01 NOTE — Telephone Encounter (Signed)
Did you ask [facility] or [facility]?  I was hoping them might be on [facility]'s $4 cash pay formulary.

## 2017-03-01 NOTE — Telephone Encounter (Signed)
Notify patient that I reviewed Jill Padilla lipid panel and Jill Padilla triglycerides were in the 500 range. So I do recommend she get fenofibrate if she can afford it. Maybe just use it for 3 months repeat and then afterwards she could try very strict low-fat /low-cholesterol and low processed food diet.  But I would like to see triglycerides come down to normal range.  If she feels like she can't afford the medication then advise strict diet and exercise. Still repeat lipid panel 3 months fasting

## 2017-03-01 NOTE — Telephone Encounter (Signed)
[facility] $76.69 and [facility] $51.44

## 2017-03-01 NOTE — Telephone Encounter (Signed)
Patient's prior authorization for fenofibrate was denied. Would you mind calling [facility] and asked what generic fenofibrate 48 mg cost cash basis. Also would you mind calling our pharmacy downstairs and asking the same question. Maybe is not better expensive?

## 2017-03-01 NOTE — Telephone Encounter (Signed)
[facility] is $48.22

## 2017-03-02 ENCOUNTER — Encounter: Payer: Self-pay | Admitting: Medical

## 2017-03-02 ENCOUNTER — Telehealth: Payer: Self-pay | Admitting: Medical

## 2017-03-02 MED ORDER — FENOFIBRATE 54 MG PO TABS: 54.0000 mg | ORAL_TABLET | Freq: Every day | ORAL | 3 refills | Status: DC

## 2017-03-02 NOTE — Telephone Encounter (Signed)
I am not sure that I have recently seen her physical exam form. Would you check my folders and see if you can find those? Please let me know.

## 2017-03-02 NOTE — Telephone Encounter (Signed)
Pt states pharmacy told her that her insurance will cover a lower or higher dosage of fenofibrate but not the 48mg . Pt also wanted to know if form for physical was filled out.

## 2017-03-02 NOTE — Telephone Encounter (Signed)
I sent in slightly higher dose of fenofibrate to her pharmacy. We'll see if they cover that dosage. If not please let me know. Notify patient.

## 2017-03-08 NOTE — Telephone Encounter (Signed)
I have not seen her forms. Would you check my folders. If not there I don't know where they are. Can she bring form over or fax. Sorry. Check like blue colored filed folder adjacent to plastic ones.

## 2017-03-08 NOTE — Telephone Encounter (Signed)
Do you have form for this pt. CPE one one 02/19/17.

## 2017-03-08 NOTE — Telephone Encounter (Signed)
Let pt a message to call back 

## 2017-03-09 NOTE — Telephone Encounter (Signed)
Pt dropped off document at front desk for provider to fill out (Physician Results form- just needing one page filled out) [facility].

## 2017-03-12 ENCOUNTER — Telehealth: Payer: Self-pay | Admitting: Medical

## 2017-03-12 NOTE — Telephone Encounter (Addendum)
[facility]  [phone]

## 2017-03-12 NOTE — Telephone Encounter (Signed)
Filled out pt physical exam form. Will you fax or call pt to pick up.

## 2017-09-18 ENCOUNTER — Telehealth: Payer: Self-pay | Admitting: Medical

## 2017-09-18 NOTE — Telephone Encounter (Signed)
Pt due for follow up please call and schedule appointment.  

## 2017-09-18 NOTE — Telephone Encounter (Signed)
Called pt and she refused to set up an follow up appt. She said she doesn't understand why she needs to see the dr for an ongoing condition. I tried to explain that the doctor likes to check to see if the medication is working but I also let her know that I wasn't a nurse or CMA so I couldn't fully tell her why the doctor needs to see her before refilling anymore prescriptions. She was really upset saying over and over she didn't want to pay $200 for a doctor appt. At the end of the conversation, she said she was not making the appt and I said thank you.

## 2017-09-20 NOTE — Telephone Encounter (Signed)
Called pt and notified her she has to follow up in order to continue receiving  Medications. Pt states she will call back and schedule appointment.

## 2017-10-10 ENCOUNTER — Telehealth: Payer: Self-pay | Admitting: Medical

## 2017-10-10 NOTE — Telephone Encounter (Signed)
Patient called [phone], left detailed VM that it is okay to take Allegra with kidney problems, taking 1 pill every 24 hours is the correct dosage. Advised if she has any other concerns, to speak to the pharmacist where she purchased the allegra or call us back at the office.

## 2017-10-10 NOTE — Telephone Encounter (Signed)
Copied from CRM [phone]. Topic: Quick Communication - See Telephone Encounter >> Oct 10, 2017  3:17 PM Cipriano BunkerLambe, Annette S wrote: CRM for notification.   Pt. Is asking if she can take Allegra,  she wants to ask since she has kidney problems.  Or if he can recommend something else.  Pt. Is miserable and is needing something.    See Telephone encounter for: 10/10/17.

## 2017-10-11 ENCOUNTER — Ambulatory Visit (INDEPENDENT_AMBULATORY_CARE_PROVIDER_SITE_OTHER): Payer: Managed Care, Other (non HMO) | Admitting: Medical

## 2017-10-11 ENCOUNTER — Encounter: Payer: Self-pay | Admitting: Medical

## 2017-10-11 VITALS — BP 128/88 | HR 88 | Temp 98.7°F | Resp 16 | Ht 63.0 in | Wt 206.6 lb

## 2017-10-11 DIAGNOSIS — R739 Hyperglycemia, unspecified: Secondary | ICD-10-CM

## 2017-10-11 DIAGNOSIS — R319 Hematuria, unspecified: Secondary | ICD-10-CM | POA: Diagnosis not present

## 2017-10-11 DIAGNOSIS — I10 Essential (primary) hypertension: Secondary | ICD-10-CM | POA: Diagnosis not present

## 2017-10-11 DIAGNOSIS — E785 Hyperlipidemia, unspecified: Secondary | ICD-10-CM | POA: Diagnosis not present

## 2017-10-11 DIAGNOSIS — J301 Allergic rhinitis due to pollen: Secondary | ICD-10-CM | POA: Diagnosis not present

## 2017-10-11 MED ORDER — LEVOCETIRIZINE DIHYDROCHLORIDE 5 MG PO TABS: 5.0000 mg | ORAL_TABLET | Freq: Every evening | ORAL | 0 refills | Status: AC

## 2017-10-11 MED ORDER — FLUTICASONE PROPIONATE 50 MCG/ACT NA SUSP: 2.0000 | Freq: Every day | NASAL | 1 refills | Status: AC

## 2017-10-11 MED ORDER — ALBUTEROL SULFATE HFA 108 (90 BASE) MCG/ACT IN AERS: 2.0000 | INHALATION_SPRAY | Freq: Four times a day (QID) | RESPIRATORY_TRACT | 2 refills | Status: AC | PRN

## 2017-10-11 NOTE — Progress Notes (Signed)
Subjective:    Patient ID: Jill Padilla X Maston, female    DOB: December 03, 1970, 47 y.o.   MRN: [id number]  HPI  Pt in for follow up.  She has hx of hyperlipidemia. She has high triglycerides but not on fenofibrate.  Admits noncompliance and only using it for a couple of days and stopped.  Hx of htn. On cozaar and hctz.  Pt has hx of  Blood in urine in past. Repeat today shows blood in urine again. Pt last menses 09-21-2017. Some blood in urine has been present past 2 years.(Pt has seen urologist one year ago and had cystoscopy which was negative) no UTI type symptoms reported today.  Pt is last a1c was 6.1.  Pt also has some sneezing, nasal congestion recently. Flared recently with allergies over the past week. Pt states she feels like she is having some wheezing minimally.  Patient expresses that she might need prednisone.   Review of Systems  Constitutional: Negative for chills, fatigue and fever.  HENT: Positive for congestion, postnasal drip, sinus pain and sneezing. Negative for drooling.   Respiratory: Positive for wheezing. Negative for cough, chest tightness and shortness of breath.   Cardiovascular: Negative for chest pain and palpitations.  Gastrointestinal: Negative for abdominal pain.  Genitourinary: Negative for difficulty urinating, dyspareunia and dysuria.  Musculoskeletal: Negative for back pain, myalgias, neck pain and neck stiffness.  Skin: Negative for rash.  Neurological: Negative for dizziness, syncope, speech difficulty, weakness, light-headedness, numbness and headaches.       Pt states if she double up on hctz feels very light headed.  Years ago when she was seeing other provider and nephrologist she reported near syncope with 2 HCTZ's.  Then her nephrologist recommended only taking 1 HCTZ.  If she sticks to that regimen she states does not have problems.  Hematological: Negative for adenopathy. Does not bruise/bleed easily.  Psychiatric/Behavioral: Negative for  behavioral problems, confusion, dysphoric mood, sleep disturbance and suicidal ideas. The patient is not nervous/anxious.       Past Medical History:  Diagnosis Date  . Depression    mild.  . Hypertension   . Migraine      Social History   Socioeconomic History  . Marital status: Married    Spouse name: Not on file  . Number of children: Not on file  . Years of education: Not on file  . Highest education level: Not on file  Occupational History  . Not on file  Social Needs  . Financial resource strain: Not on file  . Food insecurity:    Worry: Not on file    Inability: Not on file  . Transportation needs:    Medical: Not on file    Non-medical: Not on file  Tobacco Use  . Smoking status: Never Smoker  . Smokeless tobacco: Never Used  Substance and Sexual Activity  . Alcohol use: Yes    Comment: rare social.  . Drug use: No  . Sexual activity: Never    Birth control/protection: None, Surgical  Lifestyle  . Physical activity:    Days per week: Not on file    Minutes per session: Not on file  . Stress: Not on file  Relationships  . Social connections:    Talks on phone: Not on file    Gets together: Not on file    Attends religious service: Not on file    Active member of club or organization: Not on file    Attends meetings of clubs  or organizations: Not on file    Relationship status: Not on file  . Intimate partner violence:    Fear of current or ex partner: Not on file    Emotionally abused: Not on file    Physically abused: Not on file    Forced sexual activity: Not on file  Other Topics Concern  . Not on file  Social History Narrative  . Not on file    Past Surgical History:  Procedure Laterality Date  . CESAREAN SECTION    . CHOLECYSTECTOMY    . TUBAL LIGATION      Family History  Problem Relation Age of Onset  . Arthritis Mother   . Hypertension Mother   . Diabetes Mother     Allergies  Allergen Reactions  . Amlodipine Swelling     Current Outpatient Medications on File Prior to Visit  Medication Sig Dispense Refill  . hydrochlorothiazide (MICROZIDE) 12.5 MG capsule TAKE 1 CAPSULE(12.5 MG) BY MOUTH DAILY 30 capsule 0  . losartan (COZAAR) 50 MG tablet TAKE 1 TABLET(50 MG) BY MOUTH DAILY 30 tablet 0  . potassium chloride (MICRO-K) 10 MEQ CR capsule Take 10 mEq by mouth 2 (two) times daily.    . fenofibrate 54 MG tablet Take 1 tablet (54 mg total) by mouth daily. (Patient not taking: Reported on 10/11/2017) 30 tablet 3  . metFORMIN (GLUCOPHAGE) 500 MG tablet Take 1 tablet (500 mg total) by mouth 2 (two) times daily with a meal. (Patient not taking: Reported on 10/11/2017) 30 tablet 0  . traMADol (ULTRAM) 50 MG tablet Take by mouth every 6 (six) hours as needed.     No current facility-administered medications on file prior to visit.     BP 128/88   Pulse 88   Temp 98.7 F (37.1 C) (Oral)   Resp 16   Ht 5\' 3"  (1.6 m)   Wt 206 lb 9.6 oz (93.7 kg)   SpO2 100%   BMI 36.60 kg/m       Objective:   Physical Exam  General Mental Status- Alert. General Appearance- Not in acute distress.   Skin General: Color- Normal Color. Moisture- Normal Moisture.  Neck Carotid Arteries- Normal color. Moisture- Normal Moisture. No carotid bruits. No JVD.  Chest and Lung Exam Auscultation: Breath Sounds:-even and unlabored. Faint/mild expiratory wheeze.  Cardiovascular Auscultation:Rythm- Regular. Murmurs & Other Heart Sounds:Auscultation of the heart reveals- No Murmurs.  Abdomen Inspection:-Inspeection Normal. Palpation/Percussion:Note:No mass. Palpation and Percussion of the abdomen reveal- Non Tender, Non Distended + BS, no rebound or guarding.  Neurologic Cranial Nerve exam:- CN III-XII intact(No nystagmus), symmetric smile. Strength:- 5/5 equal and symmetric strength both upper and lower extremities.      Assessment & Plan:  Your blood pressure moderately well controlled today.  Continue current blood  pressure medication.  Please remember not to double up on your HCTZ as you have done before.  On today's labs we will check your potassium level.  For history of high blood sugar, continue metformin.  We will check your 65-month blood sugar average and see if we need to add medication to treatment regimen.  For high cholesterol, we will get lipid panel today.  If triglycerides are again high then will again recommend using fenofibrate.  You do have blood again on the urine sample.  However on review you did explain recent negative cystoscopy.  In light of this no further workup recommended.  If you could sign release of medical information form and give the name  of the urologist so we can review that cystoscopy report.  For recent allergic rhinitis symptoms, I prescribed Xyzal and Flonase.  Also for recent wheezing, I did prescribe albuterol inhaler.  I will follow your lab results and if your A1c is not real high then can prescribe taper dose of prednisone as you report that typically helps you a lot.  Presently a little hesitant to do so without knowing what your sugar averages are.  Follow-up in 3 months or as needed.

## 2017-10-11 NOTE — Patient Instructions (Addendum)
Your blood pressure moderately well controlled today.  Continue current blood pressure medication.  Please remember not to double up on your HCTZ as you have done before.  On today's labs we will check your potassium level.  For history of high blood sugar, continue metformin.  We will check your 657-month blood sugar average and see if we need to add medication to treatment regimen.  For high cholesterol, we will get lipid panel today.  If triglycerides are again high then will again recommend using fenofibrate.  You do have blood again on the urine sample.  However on review you did explain recent negative cystoscopy.  In light of this no further workup recommended.  If you could sign release of medical information form and give the name of the urologist so we can review that cystoscopy report.  For recent allergic rhinitis symptoms, I prescribed Xyzal and Flonase.  Also for recent wheezing, I did prescribe albuterol inhaler.  I will follow your lab results and if your A1c is not real high then can prescribe taper dose of prednisone as you report that typically helps you a lot.  Presently a little hesitant to do so without knowing what your sugar averages are.   Esperanza RichtersEdward Siedah Sedor, PA-C Follow-up in 3 months or as needed.

## 2017-10-12 ENCOUNTER — Telehealth: Payer: Self-pay | Admitting: Medical

## 2017-10-12 LAB — COMPREHENSIVE METABOLIC PANEL
ALK PHOS: 73 U/L (ref 39–117)
ALT: 21 U/L (ref 0–35)
AST: 20 U/L (ref 0–37)
Albumin: 4.3 g/dL (ref 3.5–5.2)
BUN: 8 mg/dL (ref 6–23)
CO2: 26 meq/L (ref 19–32)
Calcium: 9.6 mg/dL (ref 8.4–10.5)
Chloride: 102 mEq/L (ref 96–112)
Creatinine, Ser: 0.77 mg/dL (ref 0.40–1.20)
GFR: 85.76 mL/min (ref >60.00)
GLUCOSE: 98 mg/dL (ref 70–99)
POTASSIUM: 3.8 meq/L (ref 3.5–5.1)
Sodium: 138 mEq/L (ref 135–145)
TOTAL PROTEIN: 7.7 g/dL (ref 6.0–8.3)
Total Bilirubin: 0.4 mg/dL (ref 0.2–1.2)

## 2017-10-12 LAB — LIPID PANEL
CHOL/HDL RATIO: 6
Cholesterol: 210 mg/dL — ABNORMAL HIGH (ref 0–200)
HDL: 37 mg/dL — AB (ref >39.00)
NONHDL: 172.92
Triglycerides: 232 mg/dL — ABNORMAL HIGH (ref 0.0–149.0)
VLDL: 46.4 mg/dL — ABNORMAL HIGH (ref 0.0–40.0)

## 2017-10-12 LAB — LDL CHOLESTEROL, DIRECT: Direct LDL: 132 mg/dL

## 2017-10-12 LAB — HEMOGLOBIN A1C: HEMOGLOBIN A1C: 6.8 % — AB (ref 4.6–6.5)

## 2017-10-12 NOTE — Telephone Encounter (Unsigned)
Copied from CRM [id number]. Topic: General - Other >> Oct 12, 2017  5:36 PM [name], Jill G wrote: Pt's Rx ready and pt is still waiting on the lab results and for Dr. Alvira MondaySaguier to let her know which to start taking.

## 2017-10-15 ENCOUNTER — Other Ambulatory Visit: Payer: Self-pay

## 2017-10-15 MED ORDER — PREDNISONE 10 MG PO TABS: ORAL_TABLET | ORAL | 0 refills | Status: AC

## 2017-10-15 MED ORDER — METFORMIN HCL 1000 MG PO TABS: 1000.0000 mg | ORAL_TABLET | Freq: Two times a day (BID) | ORAL | 3 refills | Status: AC

## 2017-10-15 MED ORDER — METFORMIN HCL 500 MG PO TABS: 1000.0000 mg | ORAL_TABLET | Freq: Two times a day (BID) | ORAL | 0 refills | Status: DC

## 2017-10-15 NOTE — Telephone Encounter (Signed)
Pt notified of lab results

## 2017-10-15 NOTE — Progress Notes (Signed)
Can you resend the metformin over quantity 60 tabs with 3 refills.

## 2017-10-15 NOTE — Progress Notes (Signed)
Done. Cancelled rx for metformin 500mg 

## 2017-10-15 NOTE — Telephone Encounter (Signed)
Patient calling again, requesting call back  °

## 2017-10-15 NOTE — Addendum Note (Signed)
Addended by: Orlene OchRENCE, Kristain Filo N on: 10/15/2017 02:47 PM   Modules accepted: Orders

## 2017-10-16 ENCOUNTER — Other Ambulatory Visit: Payer: Self-pay | Admitting: Medical

## 2017-10-19 ENCOUNTER — Ambulatory Visit: Payer: Self-pay

## 2017-10-19 NOTE — Telephone Encounter (Signed)
Pt. Saw Mr. Alvira MondaySaguier 10/11/17 - states he started her on Prednisone for her allergies and breathing. States she is worse. Coughing more, having more wheezing.Appontment made for tomorrow at her request. Instructed to got to ED if breathing difficulty arises.Verbalizes understanding.  Reason for Disposition . [1] Continuous (nonstop) coughing interferes with work or school AND [2] no improvement using cough treatment per protocol  Answer Assessment - Initial Assessment Questions 1. ONSET: "When did the cough begin?"      Started 3 weeks ago 2. SEVERITY: "How bad is the cough today?"      Moderate 3. RESPIRATORY DISTRESS: "Describe your breathing."      Some wheezing 4. FEVER: "Do you have a fever?" If so, ask: "What is your temperature, how was it measured, and when did it start?"     No 5. HEMOPTYSIS: "Are you coughing up any blood?" If so ask: "How much?" (flecks, streaks, tablespoons, etc.)     No 6. TREATMENT: "What have you done so far to treat the cough?" (e.g., meds, fluids, humidifier)     Finishing Prednisone 7. CARDIAC HISTORY: "Do you have any history of heart disease?" (e.g., heart attack, congestive heart failure)      HTN 8. LUNG HISTORY: "Do you have any history of lung disease?"  (e.g., pulmonary embolus, asthma, emphysema)     Allergies 9. PE RISK FACTORS: "Do you have a history of blood clots?" (or: recent major surgery, recent prolonged travel, bedridden )     No 10. OTHER SYMPTOMS: "Do you have any other symptoms? (e.g., runny nose, wheezing, chest pain)       Wheezing and some shortness of breath 11. PREGNANCY: "Is there any chance you are pregnant?" "When was your last menstrual period?"       No 12. TRAVEL: "Have you traveled out of the country in the last month?" (e.g., travel history, exposures)       No  Protocols used: COUGH - ACUTE NON-PRODUCTIVE-A-AH

## 2017-10-20 ENCOUNTER — Ambulatory Visit: Payer: Managed Care, Other (non HMO) | Admitting: Family Medicine

## 2017-10-20 DIAGNOSIS — Z2089 Contact with and (suspected) exposure to other communicable diseases: Secondary | ICD-10-CM

## 2017-10-20 NOTE — Progress Notes (Deleted)
  Johnette AbrahamDarling X Dotter - 47 y.o. female MRN [id number]  Date of birth: 09-26-1970  SUBJECTIVE:  Including CC & ROS.  No chief complaint on file.   Johnette AbrahamDarling X Chery is a 47 y.o. female that is  ***.  ***   Review of Systems  HISTORY: Past Medical, Surgical, Social, and Family History Reviewed & Updated per EMR.   Pertinent Historical Findings include:  Past Medical History:  Diagnosis Date  . Depression    mild.  . Hypertension   . Migraine     Past Surgical History:  Procedure Laterality Date  . CESAREAN SECTION    . CHOLECYSTECTOMY    . TUBAL LIGATION      Allergies  Allergen Reactions  . Amlodipine Swelling    Family History  Problem Relation Age of Onset  . Arthritis Mother   . Hypertension Mother   . Diabetes Mother      Social History   Socioeconomic History  . Marital status: Married    Spouse name: Not on file  . Number of children: Not on file  . Years of education: Not on file  . Highest education level: Not on file  Occupational History  . Not on file  Social Needs  . Financial resource strain: Not on file  . Food insecurity:    Worry: Not on file    Inability: Not on file  . Transportation needs:    Medical: Not on file    Non-medical: Not on file  Tobacco Use  . Smoking status: Never Smoker  . Smokeless tobacco: Never Used  Substance and Sexual Activity  . Alcohol use: Yes    Comment: rare social.  . Drug use: No  . Sexual activity: Never    Birth control/protection: None, Surgical  Lifestyle  . Physical activity:    Days per week: Not on file    Minutes per session: Not on file  . Stress: Not on file  Relationships  . Social connections:    Talks on phone: Not on file    Gets together: Not on file    Attends religious service: Not on file    Active member of club or organization: Not on file    Attends meetings of clubs or organizations: Not on file    Relationship status: Not on file  . Intimate partner violence:    Fear of  current or ex partner: Not on file    Emotionally abused: Not on file    Physically abused: Not on file    Forced sexual activity: Not on file  Other Topics Concern  . Not on file  Social History Narrative  . Not on file     PHYSICAL EXAM:  VS: There were no vitals taken for this visit. Physical Exam Gen: NAD, alert, cooperative with exam, well-appearing ENT: normal lips, normal nasal mucosa,  Eye: normal EOM, normal conjunctiva and lids CV:  no edema, +2 pedal pulses   Resp: no accessory muscle use, non-labored,  GI: no masses or tenderness, no hernia  Skin: no rashes, no areas of induration  Neuro: normal tone, normal sensation to touch Psych:  normal insight, alert and oriented MSK:  ***      ASSESSMENT & PLAN:   No problem-specific Assessment & Plan notes found for this encounter.

## 2017-11-18 IMAGING — CR DG CHEST 2V
2 series · 2 of 2 positions shown · non-contrast
Comparison: 10/07/2014

CLINICAL DATA: Shortness of breath with chest tightness and nausea
3 days.

EXAM:
CHEST  2 VIEW

[w chest pa]
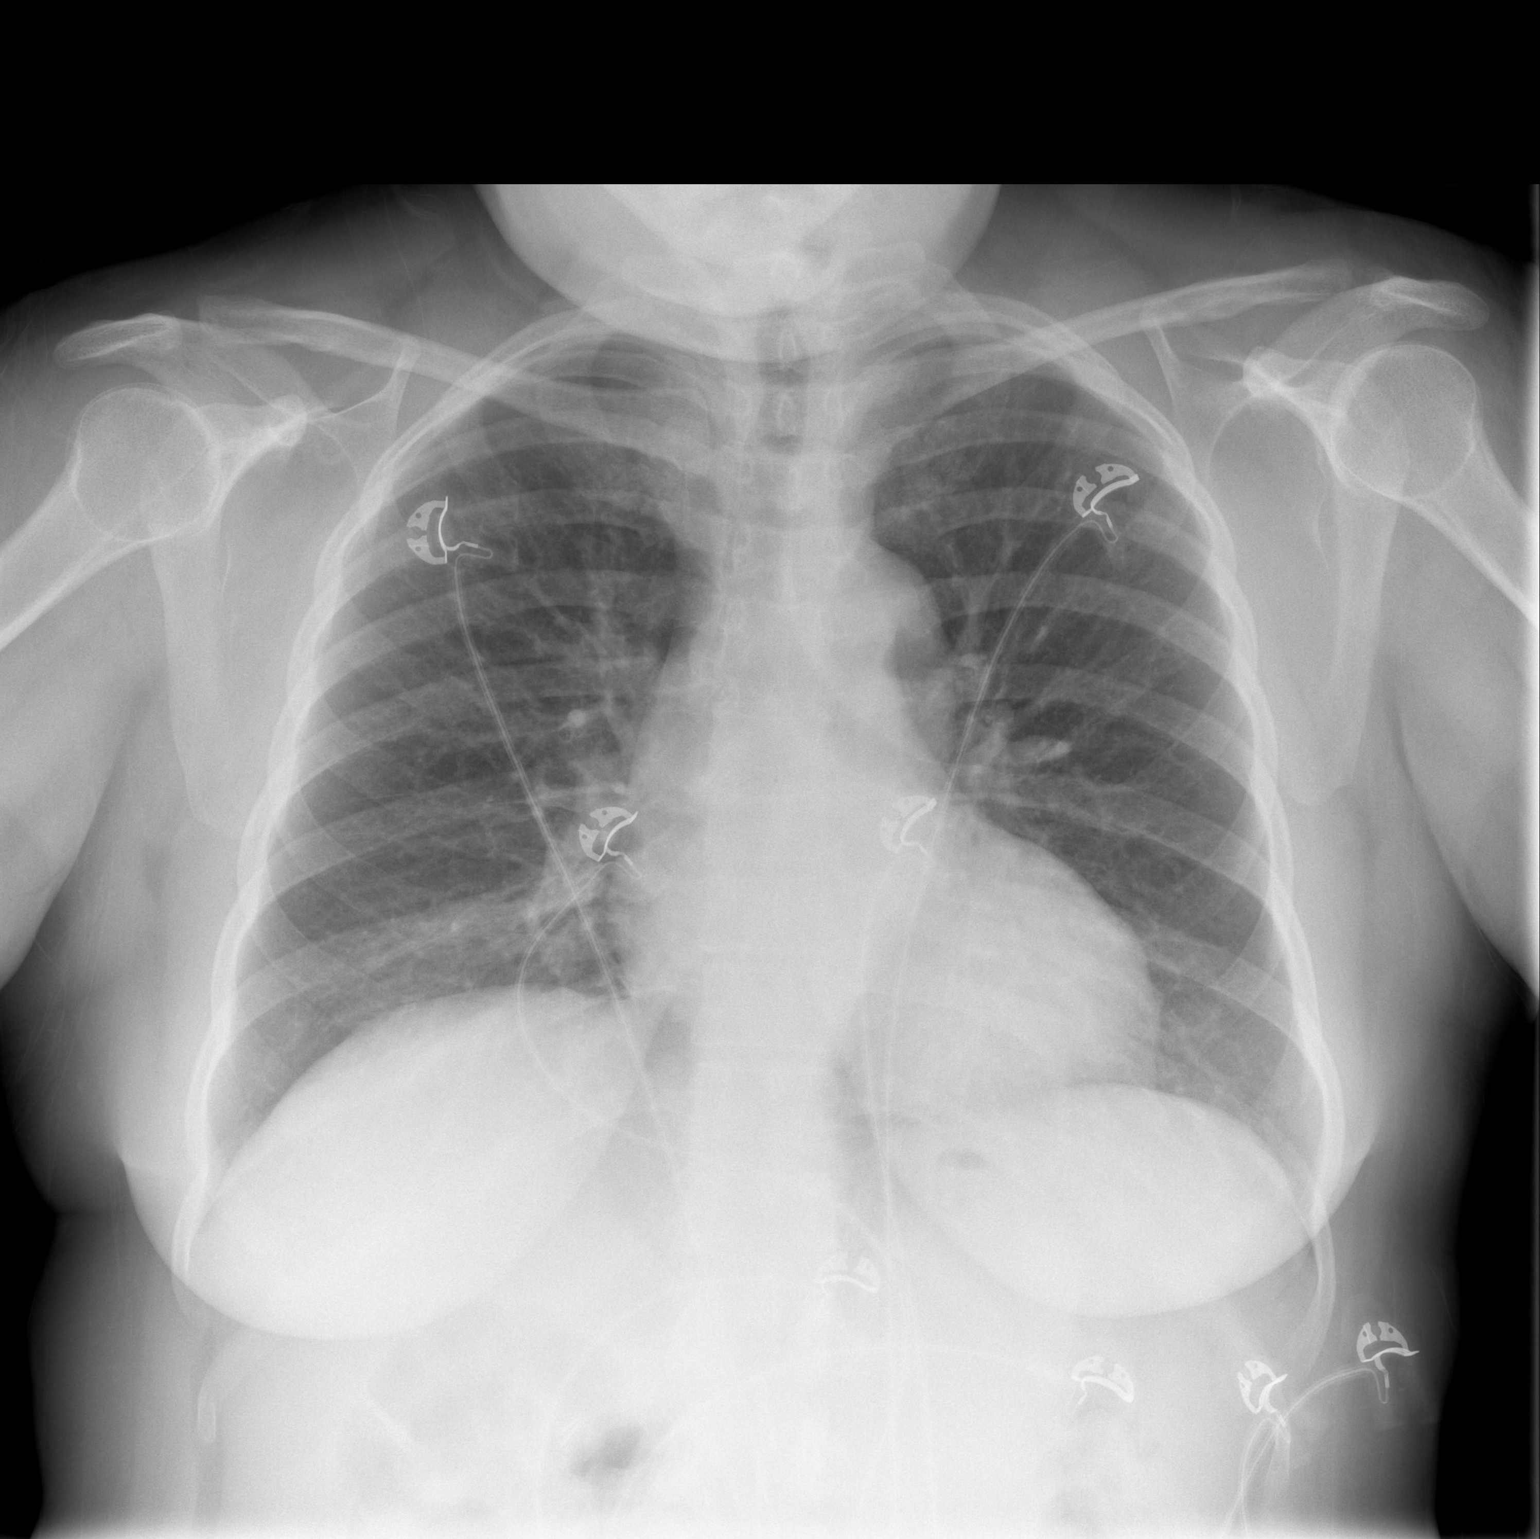

[w chest lat]
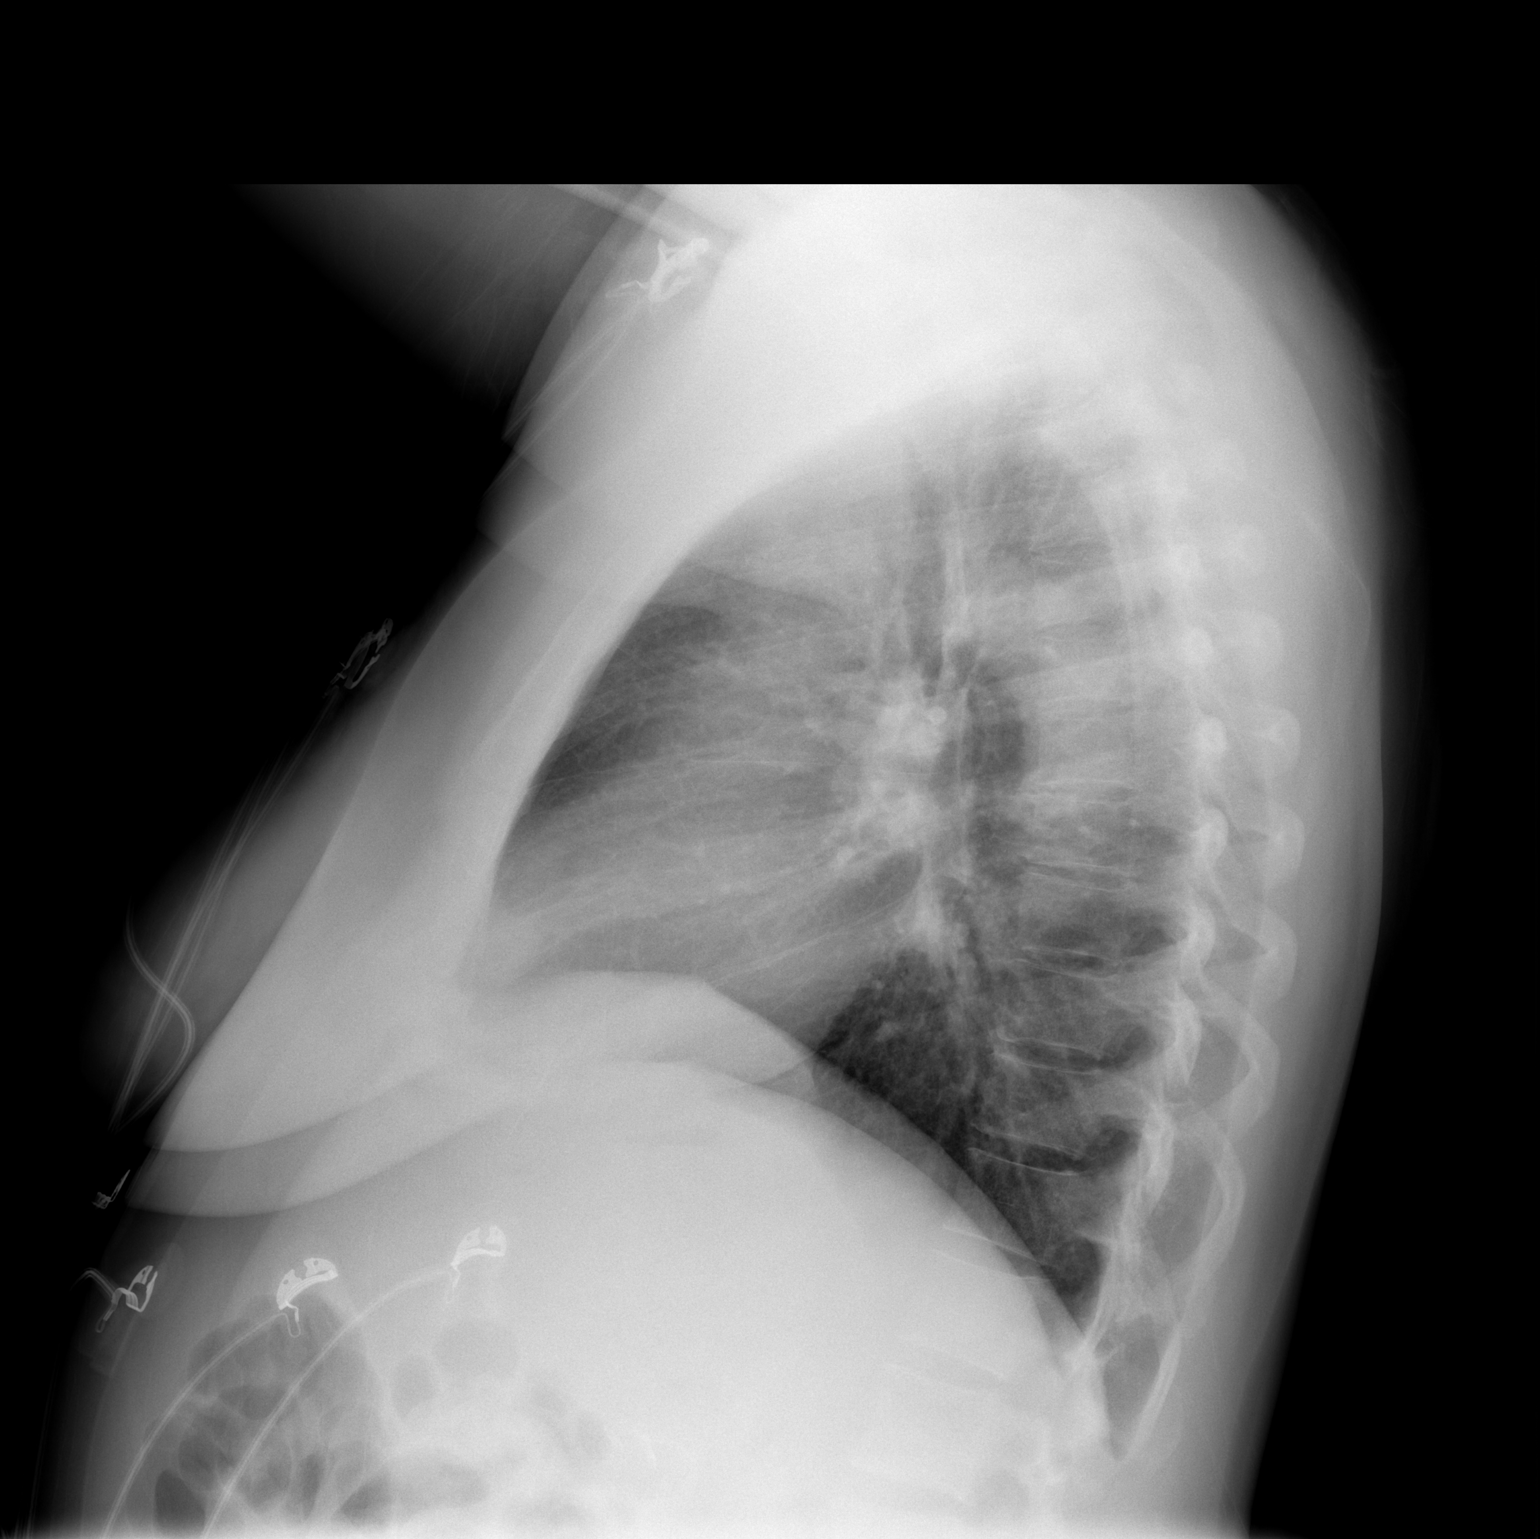

[2 of 2 positions shown; findings below may reference images not displayed]

FINDINGS: The heart size and mediastinal contours are within normal limits.
Both lungs are clear. The visualized skeletal structures are
unremarkable.
IMPRESSION: No active cardiopulmonary disease.

## 2017-12-28 ENCOUNTER — Other Ambulatory Visit: Payer: Self-pay | Admitting: Medical

## 2017-12-28 NOTE — Telephone Encounter (Signed)
Copied from CRM [id number]. Topic: Quick Communication - Rx Refill/Question >> Dec 28, 2017  5:27 PM Stovall, Shana A wrote: Medication:  osartan (COZAAR) 50 MG tablet losartan (COZAAR) 50 MG tablet [[id number]][[id number]]   Has the patient contacted their pharmacy?  Yes (Agent: If no, request that the patient contact the pharmacy for the refill.) (Agent: If yes, when and what did the pharmacy advise?)  Preferred Pharmacy (with phone number or street name): [id number] - [place], [place] - [address]  Agent: Please be advised that RX refills may take up to 3 business days. We ask that you follow-up with your pharmacy.

## 2018-02-01 ENCOUNTER — Other Ambulatory Visit: Payer: Self-pay | Admitting: Medical

## 2018-02-01 NOTE — Telephone Encounter (Signed)
Patient calling and is requesting that multiple refills be added to her refill request for the following prescriptions:   hydrochlorothiazide (MICROZIDE) 12.5 MG capsule  losartan (COZAAR) 50 MG tablet   [id number] - [place], [place] - [address] States that she is tired of having to call the office every month to get a refill. Please advise.

## 2018-04-29 ENCOUNTER — Telehealth: Payer: Self-pay | Admitting: Medical

## 2018-04-29 NOTE — Telephone Encounter (Signed)
Pt never got mammogram per computer reminder. If not will you ask her if she will get. Order as screening if willing to get now.

## 2018-05-01 ENCOUNTER — Other Ambulatory Visit: Payer: Self-pay | Admitting: Medical

## 2018-05-01 NOTE — Telephone Encounter (Signed)
Pt is due for follow up please call and schedule appointment.  

## 2018-05-03 ENCOUNTER — Other Ambulatory Visit: Payer: Self-pay | Admitting: Medical

## 2018-05-13 NOTE — Telephone Encounter (Signed)
Pt scheduled on May 20, 2018. Done.

## 2018-05-20 ENCOUNTER — Encounter: Payer: Self-pay | Admitting: Medical

## 2018-05-20 ENCOUNTER — Ambulatory Visit (INDEPENDENT_AMBULATORY_CARE_PROVIDER_SITE_OTHER): Payer: Managed Care, Other (non HMO) | Admitting: Medical

## 2018-05-20 ENCOUNTER — Telehealth: Payer: Self-pay | Admitting: Medical

## 2018-05-20 VITALS — BP 149/88 | HR 86 | Temp 98.6°F | Resp 16 | Ht 63.0 in | Wt 205.4 lb

## 2018-05-20 DIAGNOSIS — F419 Anxiety disorder, unspecified: Secondary | ICD-10-CM

## 2018-05-20 DIAGNOSIS — E785 Hyperlipidemia, unspecified: Secondary | ICD-10-CM | POA: Diagnosis not present

## 2018-05-20 DIAGNOSIS — E1169 Type 2 diabetes mellitus with other specified complication: Secondary | ICD-10-CM | POA: Diagnosis not present

## 2018-05-20 DIAGNOSIS — M25551 Pain in right hip: Secondary | ICD-10-CM

## 2018-05-20 DIAGNOSIS — I10 Essential (primary) hypertension: Secondary | ICD-10-CM

## 2018-05-20 DIAGNOSIS — M5430 Sciatica, unspecified side: Secondary | ICD-10-CM | POA: Diagnosis not present

## 2018-05-20 LAB — COMPREHENSIVE METABOLIC PANEL
ALT: 16 U/L (ref 0–35)
AST: 16 U/L (ref 0–37)
Albumin: 4.3 g/dL (ref 3.5–5.2)
Alkaline Phosphatase: 71 U/L (ref 39–117)
BILIRUBIN TOTAL: 0.5 mg/dL (ref 0.2–1.2)
BUN: 9 mg/dL (ref 6–23)
CALCIUM: 9.8 mg/dL (ref 8.4–10.5)
CO2: 26 meq/L (ref 19–32)
CREATININE: 0.72 mg/dL (ref 0.40–1.20)
Chloride: 104 mEq/L (ref 96–112)
GFR: 92.42 mL/min (ref >60.00)
Glucose, Bld: 123 mg/dL — ABNORMAL HIGH (ref 70–99)
Potassium: 4.1 mEq/L (ref 3.5–5.1)
Sodium: 139 mEq/L (ref 135–145)
Total Protein: 7.4 g/dL (ref 6.0–8.3)

## 2018-05-20 LAB — HEMOGLOBIN A1C: Hgb A1c MFr Bld: 6.5 % (ref 4.6–6.5)

## 2018-05-20 LAB — LIPID PANEL
CHOL/HDL RATIO: 6
CHOLESTEROL: 212 mg/dL — AB (ref 0–200)
HDL: 38.6 mg/dL — ABNORMAL LOW (ref >39.00)
NonHDL: 173.73
Triglycerides: 215 mg/dL — ABNORMAL HIGH (ref 0.0–149.0)
VLDL: 43 mg/dL — ABNORMAL HIGH (ref 0.0–40.0)

## 2018-05-20 LAB — LDL CHOLESTEROL, DIRECT: Direct LDL: 140 mg/dL

## 2018-05-20 MED ORDER — ROSUVASTATIN CALCIUM 5 MG PO TABS: 5.0000 mg | ORAL_TABLET | Freq: Every day | ORAL | 3 refills | Status: AC

## 2018-05-20 MED ORDER — LOSARTAN POTASSIUM 100 MG PO TABS: 100.0000 mg | ORAL_TABLET | Freq: Every day | ORAL | 3 refills | Status: AC

## 2018-05-20 NOTE — Patient Instructions (Addendum)
For high blood pressure, I am increasing your losartan dose to 100 mg. Continue hctz. Will check cmp today.  For high cholesterol, Will get lipid panel today.  For diabetes will get a1c today. Adjust treatment if necessary.  For sciatica and hip pain, I do think will get hip xray. No mid spinal pain so not getting lumbar xray presently. Can refer to sports medicine for your chronic sciatica.  For anxiety, attend counseling. If you need medication then let me know and could presribed as discussed.  Follow up in 3 weeks or as needed

## 2018-05-20 NOTE — Progress Notes (Signed)
Subjective:    Patient ID: Jill Padilla, female    DOB: 1971-01-17, 47 y.o.   MRN: [id number]  HPI  Pt in for bp check. No cardiac or neurologic signs or symptoms.  Pt states worse related to family stress. Pt has has 3 girls. Pt states she feels angry, anxious. Constant feeling stress.  Pt states some back pain lower back. Describes sciatica area pain.Pt noticed pain worse with exercise. So she stopped exercise 2 months ago.  Pt a1c was elevated in the past.   Pt reminds me hx of blood in her urine always for past 4 years. Pt has seen urologist and negative cystoscopy.     Review of Systems  Constitutional: Negative for chills, fatigue and fever.  Respiratory: Negative for cough, chest tightness, shortness of breath and wheezing.   Cardiovascular: Negative for chest pain and palpitations.  Gastrointestinal: Negative for abdominal pain.  Musculoskeletal: Positive for back pain.       More sciatica pain for 2 months.  Skin: Negative for rash.  Neurological: Negative for dizziness, seizures, speech difficulty, weakness and headaches.       Normal but one time with high level stress her head felt pressure. This was after severe problem with one of children.  Hematological: Negative for adenopathy. Does not bruise/bleed easily.  Psychiatric/Behavioral: Positive for agitation. Negative for behavioral problems, confusion, decreased concentration, dysphoric mood and sleep disturbance. The patient is nervous/anxious.     Past Medical History:  Diagnosis Date  . Depression    mild.  . Hypertension   . Migraine      Social History   Socioeconomic History  . Marital status: Married    Spouse name: Not on file  . Number of children: Not on file  . Years of education: Not on file  . Highest education level: Not on file  Occupational History  . Not on file  Social Needs  . Financial resource strain: Not on file  . Food insecurity:    Worry: Not on file    Inability: Not  on file  . Transportation needs:    Medical: Not on file    Non-medical: Not on file  Tobacco Use  . Smoking status: Never Smoker  . Smokeless tobacco: Never Used  Substance and Sexual Activity  . Alcohol use: Yes    Comment: rare social.  . Drug use: No  . Sexual activity: Never    Birth control/protection: None, Surgical  Lifestyle  . Physical activity:    Days per week: Not on file    Minutes per session: Not on file  . Stress: Not on file  Relationships  . Social connections:    Talks on phone: Not on file    Gets together: Not on file    Attends religious service: Not on file    Active member of club or organization: Not on file    Attends meetings of clubs or organizations: Not on file    Relationship status: Not on file  . Intimate partner violence:    Fear of current or ex partner: Not on file    Emotionally abused: Not on file    Physically abused: Not on file    Forced sexual activity: Not on file  Other Topics Concern  . Not on file  Social History Narrative  . Not on file    Past Surgical History:  Procedure Laterality Date  . CESAREAN SECTION    . CHOLECYSTECTOMY    .  TUBAL LIGATION      Family History  Problem Relation Age of Onset  . Arthritis Mother   . Hypertension Mother   . Diabetes Mother     Allergies  Allergen Reactions  . Amlodipine Swelling    Current Outpatient Medications on File Prior to Visit  Medication Sig Dispense Refill  . albuterol (PROVENTIL HFA;VENTOLIN HFA) 108 (90 Base) MCG/ACT inhaler Inhale 2 puffs into the lungs every 6 (six) hours as needed for wheezing or shortness of breath. 1 Inhaler 2  . fenofibrate 54 MG tablet TAKE 1 TABLET(54 MG) BY MOUTH DAILY 30 tablet 0  . fluticasone (FLONASE) 50 MCG/ACT nasal spray Place 2 sprays into both nostrils daily. 16 g 1  . hydrochlorothiazide (MICROZIDE) 12.5 MG capsule TAKE 1 CAPSULE(12.5 MG) BY MOUTH DAILY 30 capsule 0  . levocetirizine (XYZAL) 5 MG tablet Take 1 tablet (5 mg  total) by mouth every evening. 30 tablet 0  . losartan (COZAAR) 50 MG tablet TAKE 1 TABLET(50 MG) BY MOUTH DAILY 30 tablet 0  . metFORMIN (GLUCOPHAGE) 1000 MG tablet Take 1 tablet (1,000 mg total) by mouth 2 (two) times daily with a meal. 60 tablet 3  . potassium chloride (MICRO-K) 10 MEQ CR capsule Take 10 mEq by mouth 2 (two) times daily.    . predniSONE (DELTASONE) 10 MG tablet 5 tab po day 1 4 tab po day 2 3 tab po day 3 2 tab po day 4 1 tab po day 5 15 tablet 0  . traMADol (ULTRAM) 50 MG tablet Take by mouth every 6 (six) hours as needed.     No current facility-administered medications on file prior to visit.     BP (!) 149/88   Pulse 86   Temp 98.6 F (37 C) (Oral)   Resp 16   Ht 5\' 3"  (1.6 m)   Wt 205 lb 6.4 oz (93.2 kg)   SpO2 97%   BMI 36.38 kg/m       Objective:   Physical Exam  General Mental Status- Alert. General Appearance- Not in acute distress.   Skin General: Color- Normal Color. Moisture- Normal Moisture.  Neck Carotid Arteries- Normal color. Moisture- Normal Moisture. No carotid bruits. No JVD.  Chest and Lung Exam Auscultation: Breath Sounds:-Normal.  Cardiovascular Auscultation:Rythm- Regular. Murmurs & Other Heart Sounds:Auscultation of the heart reveals- No Murmurs.  Abdomen Inspection:-Inspeection Normal. Palpation/Percussion:Note:No mass. Palpation and Percussion of the abdomen reveal- Non Tender, Non Distended + BS, no rebound or guarding.   Neurologic Cranial Nerve exam:- CN III-XII intact(No nystagmus), symmetric smile. Strength:- 5/5 equal and symmetric strength both upper and lower extremities.   Back Mid lumbar spine tenderness to palpation. Rt si area tenderness to palpation. Pain on straight leg lift. Pain on lateral movements and flexion/extension of the spine.  Lower ext neurologic  L5-S1 sensation intact bilaterally. Normal patellar reflexes bilaterally. No foot drop bilaterally.  Rt hip-tenderness to  palpation.      Assessment & Plan:  For high blood pressure, I am increasing your losartan dose to 100 mg. Continue hctz. Will check cmp today.  For high cholesterol, Will get lipid panel today.  For diabetes will get a1c today. Adjust treatment if necessary.  For sciatica and hip pain, I do think will get hip xray. No mid spinal pain so not getting lumbar xray presently. Can refer to sports medicine for your chronic sciatica.  For anxiety, attend counseling. If you need medication then let me know and could presribed as discussed.  Follow up in 3 weeks or as needed  40 minutes spent with pt. 50% of time spent couneling on various diagnosis and approach to treatement depending on how she does. Particularly regarding stress and anxiety most recently.  Esperanza Richters, PA-C

## 2018-05-20 NOTE — Telephone Encounter (Signed)
Rx crestor sent to pt pharmacy. 

## 2018-05-27 ENCOUNTER — Telehealth: Payer: Self-pay | Admitting: Medical

## 2018-05-27 NOTE — Telephone Encounter (Signed)
Copied from CRM [id number]. Topic: General - Other >> May 27, 2018 12:31 PM Ronney LionArrington, Shykila A wrote: Reason for CRM: Pt called in wanting to speak to a nurse regarding her lab results. She says she has not received a call regarding this.  NT was busy.

## 2018-05-28 ENCOUNTER — Telehealth: Payer: Self-pay | Admitting: Medical

## 2018-05-28 MED ORDER — AZITHROMYCIN 250 MG PO TABS: ORAL_TABLET | ORAL | 0 refills | Status: AC

## 2018-05-28 MED ORDER — HYDROCODONE-HOMATROPINE 5-1.5 MG/5ML PO SYRP: 5.0000 mL | ORAL_SOLUTION | Freq: Four times a day (QID) | ORAL | 0 refills | Status: AC | PRN

## 2018-05-28 NOTE — Telephone Encounter (Signed)
See result note. Left message on voicemail for pt to return call to office for results.

## 2018-05-28 NOTE — Telephone Encounter (Signed)
Rx hycodan and zpack sent to pt pharmacy.

## 2018-05-29 ENCOUNTER — Other Ambulatory Visit: Payer: Self-pay | Admitting: Medical

## 2018-06-19 ENCOUNTER — Ambulatory Visit: Payer: Managed Care, Other (non HMO) | Admitting: Medical

## 2018-07-04 IMAGING — DX DG KNEE 3 VIEWS*L*
3 series · 3 of 3 positions shown · non-contrast
Comparison: Left knee films of 04/25/2012

CLINICAL DATA: Chronic bilateral knee pain, no acute injury

EXAM:
LEFT KNEE - 3 VIEW

[knee ap]
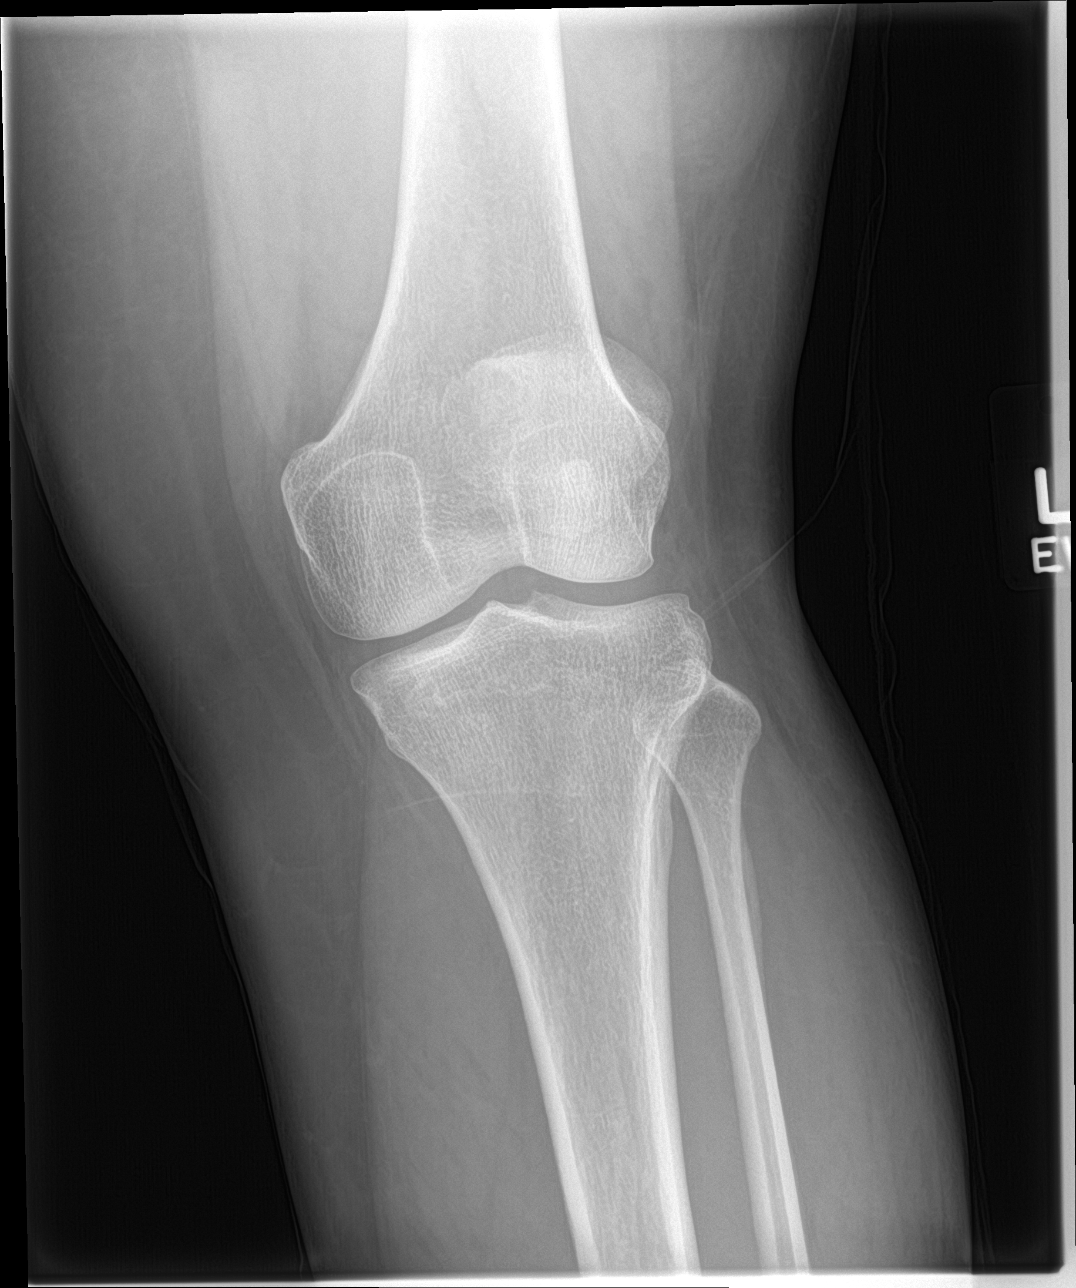

[knee lat]
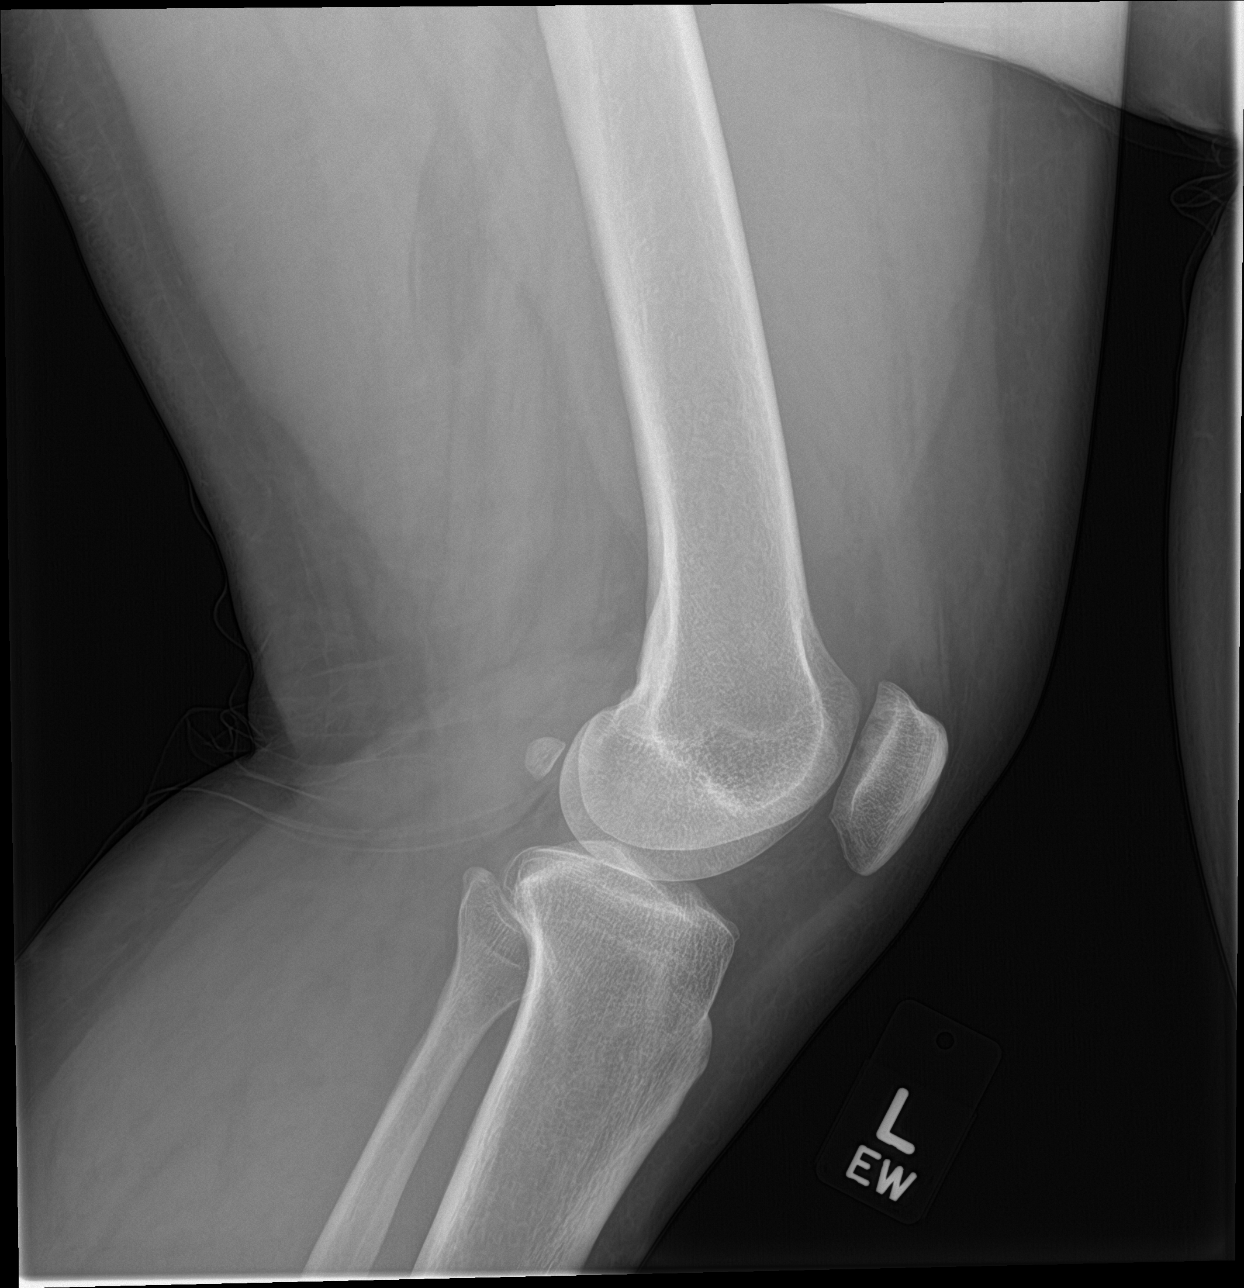

[knee sunrise]
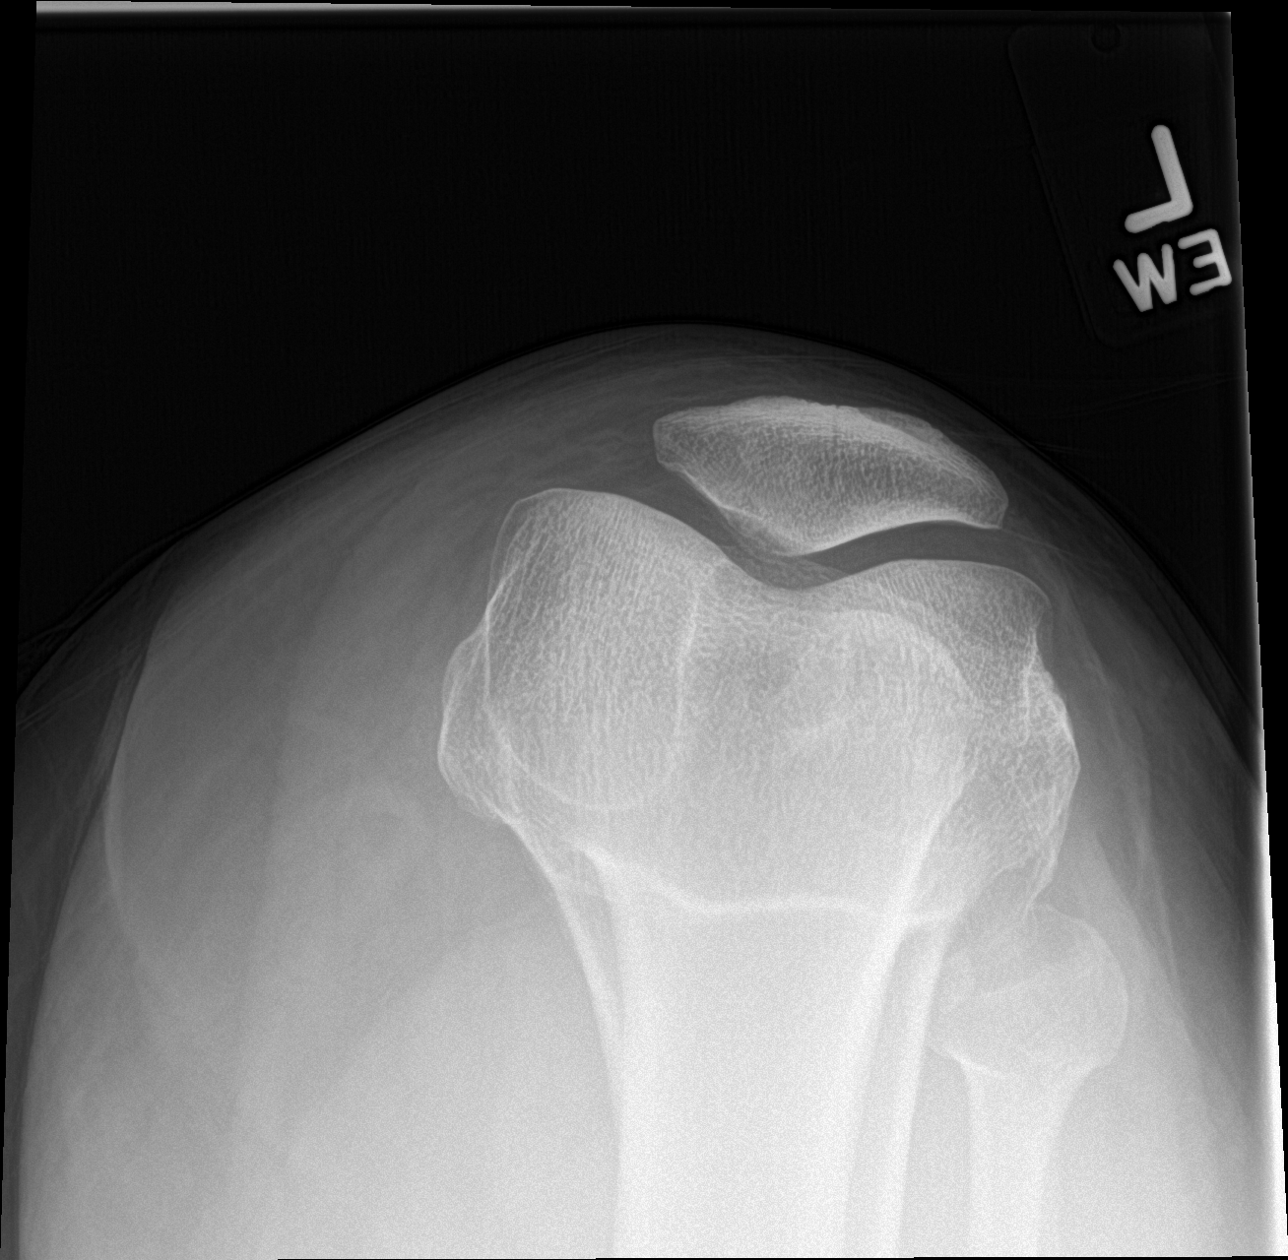

[3 of 3 positions shown; findings below may reference images not displayed]

FINDINGS: Left knee joint spaces are relatively well preserved. No fracture is
seen. No joint effusion is noted.
IMPRESSION: Negative.

## 2018-08-23 ENCOUNTER — Other Ambulatory Visit: Payer: Self-pay | Admitting: Medical

## 2018-08-23 MED ORDER — HYDROCHLOROTHIAZIDE 12.5 MG PO CAPS: ORAL_CAPSULE | ORAL | 2 refills | Status: AC

## 2018-08-23 NOTE — Telephone Encounter (Signed)
Copied from CRM [id number]. Topic: Quick Communication - Rx Refill/Question >> Aug 23, 2018 11:49 AM Marylen Ponto wrote: Medication: hydrochlorothiazide (MICROZIDE) 12.5 MG capsule  Has the patient contacted their pharmacy? yes   Preferred Pharmacy (with phone number or street name): [id number] - [place], [place] - [address] [phone] (Phone)  [phone] (Fax)  Agent: Please be advised that RX refills may take up to 3 business days. We ask that you follow-up with your pharmacy.

## 2018-10-09 ENCOUNTER — Telehealth: Payer: Self-pay | Admitting: Medical

## 2018-10-09 NOTE — Telephone Encounter (Signed)
On review. Pt has htn and did not follow up 3 weeks as advised. Would you offer pt virtual visit. But would want her to check her bp at home or pharmacy first before/day of visit.

## 2018-10-09 NOTE — Telephone Encounter (Signed)
LVM for pt to call office an schedule VOV fu appt per provider.

## 2018-11-21 ENCOUNTER — Telehealth: Payer: Self-pay | Admitting: Medical

## 2018-11-21 ENCOUNTER — Other Ambulatory Visit: Payer: Self-pay | Admitting: Medical

## 2018-11-21 NOTE — Telephone Encounter (Signed)
See Jill Padilla notes about pt upset about having to come in.  Please explain that she has diabetes, htn and high cholesterol. This is combination of conditions that needs visit every 3 months. I have been asking her to see me every 6 months as these conditions if out of control can lead to heart attacks and strokes over time. I understand her concern about cost. And these are tough times. Will refill medication for one month. If asking her to coming every 6 months to much for her let me know.  With experience over the years I find the less patients comes in worse outcomes.   Ask if she will schedule virtual visit for one month.

## 2018-11-21 NOTE — Telephone Encounter (Signed)
Opened to review 

## 2018-11-21 NOTE — Telephone Encounter (Signed)
Pt called in regarding a refill she needs for a medication for hypertension.  Pt does not want to do a virtual visit or come in potentially for a lab visit.  Pt does not understand why we make her come in so often to monitor this when it is a condition she has had for a while.  It costs her a lot of money when she has to come in.  I tried to explain to the pt, as she was speaking over me, there is protocol to follow and she would have to come in to be monitored for certain conditions and we have no control over what her insurance will cover or not for these visits.  Pt asked me if that was the only way she would be able to get her refills and I said yes.  Pt then hung up the phone on me without making a visit.  Pt stated she has been thinking about going elsewhere for care because in the past she has never had to come in this often to be seen to get her meds.

## 2018-11-22 MED ORDER — LOSARTAN POTASSIUM 100 MG PO TABS: 100.0000 mg | ORAL_TABLET | Freq: Every day | ORAL | 0 refills | Status: AC

## 2018-11-22 NOTE — Telephone Encounter (Signed)
Sending in losartan 100 mg rx for one month. When she runs out and if she still does not want to come in them might need to dismiss her.

## 2018-11-22 NOTE — Telephone Encounter (Signed)
All this has already been explained to pt numerus of time. Pt does this every time it is time for her to follow up. Pt is aware.

## 2018-11-22 NOTE — Addendum Note (Signed)
Addended by: Gwenevere Abbot on: 11/22/2018 09:15 AM   Modules accepted: Orders

## 2018-12-19 ENCOUNTER — Other Ambulatory Visit: Payer: Self-pay | Admitting: Medical
